# Patient Record
Sex: Female | Born: 1960 | Race: White | Hispanic: No | Marital: Married | Smoking: Never smoker
Health system: Southern US, Community
[De-identification: ages and names within clinical notes are randomized; demographics above are authoritative.]

## PROBLEM LIST (undated history)

## (undated) DIAGNOSIS — M1990 Unspecified osteoarthritis, unspecified site: Secondary | ICD-10-CM

## (undated) DIAGNOSIS — I10 Essential (primary) hypertension: Secondary | ICD-10-CM

## (undated) DIAGNOSIS — M719 Bursopathy, unspecified: Secondary | ICD-10-CM

## (undated) DIAGNOSIS — F419 Anxiety disorder, unspecified: Secondary | ICD-10-CM

## (undated) DIAGNOSIS — F32A Depression, unspecified: Secondary | ICD-10-CM

## (undated) DIAGNOSIS — M7580 Other shoulder lesions, unspecified shoulder: Secondary | ICD-10-CM

## (undated) DIAGNOSIS — E119 Type 2 diabetes mellitus without complications: Secondary | ICD-10-CM

## (undated) DIAGNOSIS — R519 Headache, unspecified: Secondary | ICD-10-CM

## (undated) HISTORY — PX: CHOLECYSTECTOMY: SHX55

## (undated) HISTORY — PX: KNEE SURGERY: SHX244

## (undated) HISTORY — PX: TUBAL LIGATION: SHX77

## (undated) HISTORY — PX: MOUTH SURGERY: SHX715

---

## 1998-02-19 ENCOUNTER — Other Ambulatory Visit: Admission: RE | Admit: 1998-02-19 | Discharge: 1998-02-19 | Payer: Self-pay | Admitting: Gynecology

## 2002-05-07 ENCOUNTER — Emergency Department (HOSPITAL_COMMUNITY): Admission: EM | Admit: 2002-05-07 | Discharge: 2002-05-07 | Payer: Self-pay | Admitting: Emergency Medicine

## 2005-02-05 ENCOUNTER — Emergency Department (HOSPITAL_COMMUNITY): Admission: EM | Admit: 2005-02-05 | Discharge: 2005-02-05 | Payer: Self-pay | Admitting: Emergency Medicine

## 2008-03-02 ENCOUNTER — Encounter: Admission: RE | Admit: 2008-03-02 | Discharge: 2008-03-02 | Payer: Self-pay | Admitting: Neurology

## 2008-09-19 ENCOUNTER — Emergency Department (HOSPITAL_COMMUNITY): Admission: EM | Admit: 2008-09-19 | Discharge: 2008-09-19 | Payer: Self-pay | Admitting: Emergency Medicine

## 2009-05-15 ENCOUNTER — Emergency Department (HOSPITAL_COMMUNITY): Admission: EM | Admit: 2009-05-15 | Discharge: 2009-05-16 | Payer: Self-pay | Admitting: Emergency Medicine

## 2010-06-07 LAB — POCT CARDIAC MARKERS
CKMB, poc: 1.2 ng/mL (ref 1.0–8.0)
CKMB, poc: 1.6 ng/mL (ref 1.0–8.0)
Myoglobin, poc: 47.7 ng/mL (ref 12–200)
Myoglobin, poc: 70.5 ng/mL (ref 12–200)
Troponin i, poc: <0.05 ng/mL (ref 0.00–0.09)
Troponin i, poc: <0.05 ng/mL (ref 0.00–0.09)

## 2010-06-07 LAB — DIFFERENTIAL
Basophils Absolute: 0 10*3/uL (ref 0.0–0.1)
Lymphocytes Relative: 24 % (ref 12–46)
Neutro Abs: 4.7 10*3/uL (ref 1.7–7.7)
Neutrophils Relative %: 66 % (ref 43–77)

## 2010-06-07 LAB — HEPATIC FUNCTION PANEL
ALT: 12 U/L (ref 0–35)
AST: 17 U/L (ref 0–37)
Albumin: 3.3 g/dL — ABNORMAL LOW (ref 3.5–5.2)
Alkaline Phosphatase: 70 U/L (ref 39–117)
Bilirubin, Direct: 0.1 mg/dL (ref 0.0–0.3)
Indirect Bilirubin: 0.6 mg/dL (ref 0.3–0.9)
Total Bilirubin: 0.7 mg/dL (ref 0.3–1.2)
Total Protein: 6.1 g/dL (ref 6.0–8.3)

## 2010-06-07 LAB — BASIC METABOLIC PANEL
BUN: 9 mg/dL (ref 6–23)
Calcium: 8.9 mg/dL (ref 8.4–10.5)
GFR calc non Af Amer: >60 mL/min (ref >60)
Glucose, Bld: 91 mg/dL (ref 70–99)

## 2010-06-07 LAB — CBC
Platelets: 218 10*3/uL (ref 150–400)
RDW: 12.4 % (ref 11.5–15.5)

## 2010-06-07 LAB — D-DIMER, QUANTITATIVE: D-Dimer, Quant: 0.42 ug/mL-FEU (ref 0.00–0.48)

## 2011-12-05 ENCOUNTER — Emergency Department (HOSPITAL_COMMUNITY)
Admission: EM | Admit: 2011-12-05 | Discharge: 2011-12-05 | Disposition: A | Discharge status: Home / Self Care | Payer: 59 | Attending: Emergency Medicine | Admitting: Emergency Medicine

## 2011-12-05 ENCOUNTER — Encounter (HOSPITAL_COMMUNITY): Payer: Self-pay | Admitting: *Deleted

## 2011-12-05 ENCOUNTER — Emergency Department (HOSPITAL_COMMUNITY): Payer: 59

## 2011-12-05 DIAGNOSIS — Q762 Congenital spondylolisthesis: Secondary | ICD-10-CM | POA: Insufficient documentation

## 2011-12-05 DIAGNOSIS — M51379 Other intervertebral disc degeneration, lumbosacral region without mention of lumbar back pain or lower extremity pain: Secondary | ICD-10-CM | POA: Insufficient documentation

## 2011-12-05 DIAGNOSIS — Z886 Allergy status to analgesic agent status: Secondary | ICD-10-CM | POA: Insufficient documentation

## 2011-12-05 DIAGNOSIS — M5416 Radiculopathy, lumbar region: Secondary | ICD-10-CM

## 2011-12-05 DIAGNOSIS — M5137 Other intervertebral disc degeneration, lumbosacral region: Secondary | ICD-10-CM | POA: Insufficient documentation

## 2011-12-05 DIAGNOSIS — Z91038 Other insect allergy status: Secondary | ICD-10-CM | POA: Insufficient documentation

## 2011-12-05 MED ORDER — ONDANSETRON 8 MG PO TBDP
8.0000 mg | ORAL_TABLET | Freq: Once | ORAL | Status: AC
  Administered 2011-12-05: 8 mg via ORAL
  Filled 2011-12-05: qty 1

## 2011-12-05 MED ORDER — KETOROLAC TROMETHAMINE 60 MG/2ML IM SOLN
60.0000 mg | Freq: Once | INTRAMUSCULAR | Status: AC
  Administered 2011-12-05: 60 mg via INTRAMUSCULAR
  Filled 2011-12-05: qty 2

## 2011-12-05 MED ORDER — CYCLOBENZAPRINE HCL 10 MG PO TABS: 10.0000 mg | ORAL_TABLET | Freq: Two times a day (BID) | ORAL | Status: DC | PRN

## 2011-12-05 MED ORDER — CYCLOBENZAPRINE HCL 10 MG PO TABS
10.0000 mg | ORAL_TABLET | Freq: Once | ORAL | Status: AC
  Administered 2011-12-05: 10 mg via ORAL
  Filled 2011-12-05: qty 1

## 2011-12-05 MED ORDER — ONDANSETRON HCL 8 MG PO TABS: 8.0000 mg | ORAL_TABLET | Freq: Three times a day (TID) | ORAL | Status: DC | PRN

## 2011-12-05 MED ORDER — MORPHINE SULFATE 2 MG/ML IJ SOLN
8.0000 mg | Freq: Once | INTRAMUSCULAR | Status: AC
  Administered 2011-12-05: 8 mg via INTRAMUSCULAR
  Filled 2011-12-05: qty 4

## 2011-12-05 MED ORDER — OXYCODONE-ACETAMINOPHEN 10-325 MG PO TABS: 1.0000 | ORAL_TABLET | ORAL | Status: DC | PRN

## 2011-12-05 NOTE — ED Notes (Signed)
Pain lower back to lt hip, onset yesterday, no known injury

## 2011-12-05 NOTE — ED Provider Notes (Signed)
History     CSN: [id number]  Arrival date & time 12/05/11  1620   First MD Initiated Contact with Patient 12/05/11 1704      Chief Complaint  Patient presents with  . Back Pain    (Consider location/radiation/quality/duration/timing/severity/associated sxs/prior treatment) HPI Comments: Patient with hx of chronic low back pain states that she developed a sharp, burning, shooting pain in her left lower back that radiates into her left buttocks , thigh and knee.  She is unsure of known injury.  States that she currently is under the care of a local chiropractor for her back pain and seen him PTA and was advised to come to the ED for further evaluation.  She denies saddle anesthesia's, urinary sx's, incontinence, motor weakness or numbness of the leg, abd pain, or fever  Patient is a 51 y.o. female presenting with back pain. The history is provided by the patient and the spouse.  Back Pain  This is a new problem. Episode onset: on day PTA. The problem occurs constantly. The problem has been gradually worsening. The pain is associated with no known injury. The pain is present in the lumbar spine and sacro-iliac joint. The quality of the pain is described as aching, burning and shooting. The pain radiates to the left thigh and left knee. The pain is moderate. The symptoms are aggravated by bending, twisting and certain positions. The pain is the same all the time. Associated symptoms include leg pain. Pertinent negatives include no chest pain, no fever, no numbness, no headaches, no abdominal pain, no abdominal swelling, no bowel incontinence, no perianal numbness, no bladder incontinence, no dysuria, no pelvic pain, no paresthesias, no paresis, no tingling and no weakness. She has tried muscle relaxants and analgesics for the symptoms. The treatment provided no relief.    History reviewed. No pertinent past medical history.  Past Surgical History  Procedure Date  . Knee surgery   .  Cholecystectomy   . Tubal ligation     History reviewed. No pertinent family history.  History  Substance Use Topics  . Smoking status: Never Smoker   . Smokeless tobacco: Not on file  . Alcohol Use: Yes     occ    OB History    Grav Para Term Preterm Abortions TAB SAB Ect Mult Living                  Review of Systems  Constitutional: Negative for fever.  Respiratory: Negative for shortness of breath.   Cardiovascular: Negative for chest pain.  Gastrointestinal: Negative for vomiting, abdominal pain, constipation and bowel incontinence.  Genitourinary: Negative for bladder incontinence, dysuria, hematuria, flank pain, decreased urine volume, difficulty urinating and pelvic pain.       No perineal numbness or incontinence of urine or feces  Musculoskeletal: Positive for back pain and gait problem. Negative for joint swelling.  Skin: Negative for rash.  Neurological: Negative for dizziness, tingling, weakness, numbness, headaches and paresthesias.  All other systems reviewed and are negative.    Allergies  Bee venom; Codeine; and Darvocet  Home Medications   Current Outpatient Rx  Name Route Sig Dispense Refill  . ACETAMINOPHEN ER 650 MG PO TBCR Oral Take 650 mg by mouth 2 (two) times daily as needed. For pain    . CARISOPRODOL 350 MG PO TABS Oral Take 350 mg by mouth daily as needed. For pain in back    . MELOXICAM PO Oral Take 1 tablet by mouth daily.  BP 122/56  Pulse 63  Temp 98.2 F (36.8 C) (Oral)  Resp 18  Ht 5' 6.5" (1.689 m)  Wt 340 lb (154.223 kg)  BMI 54.06 kg/m2  SpO2 96%  LMP 10/24/2011  Physical Exam  Nursing note and vitals reviewed. Constitutional: She is oriented to person, place, and time. She appears well-developed and well-nourished. No distress.       Morbidly obese  HENT:  Head: Normocephalic and atraumatic.  Neck: Normal range of motion. Neck supple.  Cardiovascular: Normal rate, regular rhythm and intact distal pulses.   No  murmur heard. Pulmonary/Chest: Effort normal and breath sounds normal.  Musculoskeletal: She exhibits tenderness. She exhibits no edema.       Lumbar back: She exhibits tenderness and pain. She exhibits normal range of motion, no swelling, no deformity, no laceration and normal pulse.       Back:  Neurological: She is alert and oriented to person, place, and time. She has normal strength. No cranial nerve deficit or sensory deficit. She exhibits normal muscle tone. Coordination and gait normal.  Reflex Scores:      Patellar reflexes are 2+ on the right side and 2+ on the left side.      Achilles reflexes are 2+ on the right side and 2+ on the left side. Skin: Skin is warm and dry.    ED Course  Procedures (including critical care time)  Labs Reviewed - No data to display Dg Lumbar Spine Complete  12/05/2011  *RADIOLOGY REPORT*  Clinical Data: 50 year old female with low back pain radiating to the left hip.  LUMBAR SPINE - COMPLETE 4+ VIEW  Comparison: None.  Findings: Normal lumbar segmentation.  Trace anterolisthesis of L4- L5 associated with moderate facet hypertrophy.  Relatively preserved disc spaces except at L1-L2 where there is associated endplate spurring.  No pars fracture.  Sacral ala and SI joints within normal limits.  IMPRESSION: 1. No acute osseous abnormality in the lumbar spine. 2.  Chronic L1-L2 disc degeneration.  Evidence of chronic mild grade 1 spondylolisthesis at L4-L5.   Original Report Authenticated By: Harley Hallmark, M.D.         MDM   Vitals stable, patient is feeling better.     Patient has ttp of the left lumbar spine and paraspinal muscles.  No focal neuro deficits on exam. No saddle anesthesia, incontinence, or foot drop.   Ambulated to the restroom with a slow, but steady gait.   Sx's are concerning for nerve impingement syndrome.  Pt has appt with her PMD, Dr. Margo Aye tomorrow.  Pt will likely need MRI, but I do not suspect emergent/surgical neurological  process at this time.    The patient appears reasonably screened and/or stabilized for discharge and I doubt any other medical condition or other [facility] requiring further screening, evaluation, or treatment in the ED at this time prior to discharge.   Prescribed: Flexeril Percocet 10mg  #15 zofran    Saniyya Gau L. [address], [place] 12/07/11 2136

## 2011-12-09 NOTE — ED Provider Notes (Signed)
Medical screening examination/treatment/procedure(s) were performed by non-physician practitioner and as supervising physician I was immediately available for consultation/collaboration.   Shelda Jakes, MD 12/09/11 1007

## 2012-08-06 ENCOUNTER — Emergency Department (HOSPITAL_COMMUNITY)
Admission: EM | Admit: 2012-08-06 | Discharge: 2012-08-06 | Disposition: A | Discharge status: Home / Self Care | Payer: 59 | Attending: Emergency Medicine | Admitting: Emergency Medicine

## 2012-08-06 ENCOUNTER — Emergency Department (HOSPITAL_COMMUNITY): Payer: 59

## 2012-08-06 ENCOUNTER — Encounter (HOSPITAL_COMMUNITY): Payer: Self-pay

## 2012-08-06 DIAGNOSIS — W278XXA Contact with other nonpowered hand tool, initial encounter: Secondary | ICD-10-CM | POA: Insufficient documentation

## 2012-08-06 DIAGNOSIS — Y929 Unspecified place or not applicable: Secondary | ICD-10-CM | POA: Insufficient documentation

## 2012-08-06 DIAGNOSIS — Z23 Encounter for immunization: Secondary | ICD-10-CM | POA: Insufficient documentation

## 2012-08-06 DIAGNOSIS — S61432A Puncture wound without foreign body of left hand, initial encounter: Secondary | ICD-10-CM

## 2012-08-06 DIAGNOSIS — Y9389 Activity, other specified: Secondary | ICD-10-CM | POA: Insufficient documentation

## 2012-08-06 DIAGNOSIS — R209 Unspecified disturbances of skin sensation: Secondary | ICD-10-CM | POA: Insufficient documentation

## 2012-08-06 DIAGNOSIS — Z79899 Other long term (current) drug therapy: Secondary | ICD-10-CM | POA: Insufficient documentation

## 2012-08-06 DIAGNOSIS — Z8739 Personal history of other diseases of the musculoskeletal system and connective tissue: Secondary | ICD-10-CM | POA: Insufficient documentation

## 2012-08-06 DIAGNOSIS — S61409A Unspecified open wound of unspecified hand, initial encounter: Secondary | ICD-10-CM | POA: Insufficient documentation

## 2012-08-06 HISTORY — DX: Other shoulder lesions, unspecified shoulder: M75.80

## 2012-08-06 HISTORY — DX: Bursopathy, unspecified: M71.9

## 2012-08-06 MED ORDER — SULFAMETHOXAZOLE-TMP DS 800-160 MG PO TABS
1.0000 | ORAL_TABLET | Freq: Once | ORAL | Status: AC
  Administered 2012-08-06: 1 via ORAL
  Filled 2012-08-06: qty 1

## 2012-08-06 MED ORDER — TRAMADOL HCL 50 MG PO TABS
50.0000 mg | ORAL_TABLET | Freq: Once | ORAL | Status: AC
  Administered 2012-08-06: 50 mg via ORAL
  Filled 2012-08-06: qty 1

## 2012-08-06 MED ORDER — TETANUS-DIPHTH-ACELL PERTUSSIS 5-2.5-18.5 LF-MCG/0.5 IM SUSP
0.5000 mL | Freq: Once | INTRAMUSCULAR | Status: AC
  Administered 2012-08-06: 0.5 mL via INTRAMUSCULAR
  Filled 2012-08-06: qty 0.5

## 2012-08-06 MED ORDER — SULFAMETHOXAZOLE-TRIMETHOPRIM 800-160 MG PO TABS: 1.0000 | ORAL_TABLET | Freq: Two times a day (BID) | ORAL | Status: DC

## 2012-08-06 MED ORDER — TRAMADOL HCL 50 MG PO TABS: 50.0000 mg | ORAL_TABLET | Freq: Four times a day (QID) | ORAL | Status: DC | PRN

## 2012-08-06 NOTE — ED Provider Notes (Signed)
History     CSN: [id number]  Arrival date & time 08/06/12  1440   First MD Initiated Contact with Patient 08/06/12 [phone]      Chief Complaint  Patient presents with  . Extremity Laceration    (Consider location/radiation/quality/duration/timing/severity/associated sxs/prior treatment) HPI Comments: Patient c/o laceration to the left palm that occurred while using scissors.  C/o shooting type pain from the middle palm to the left thumb.  states the thumb feels numb and tingling.  Pain to the hand with movement of the thumb and other fingers.  She is unsure of her last tetanus.    Patient is a 52 y.o. female presenting with skin laceration. The history is provided by the patient.  Laceration Location:  Hand Hand laceration location:  L hand and L palm Length (cm):  1 cm Depth:  Through dermis Quality comment:  Puncture wound Bleeding: controlled   Time since incident: just PTA. Injury mechanism: scissors. Pain details:    Quality:  Sharp and shooting   Severity:  Mild   Timing:  Constant   Progression:  Unchanged Foreign body present:  No foreign bodies Relieved by:  Nothing Worsened by:  Movement Ineffective treatments:  None tried Tetanus status:  Out of date   Past Medical History  Diagnosis Date  . Bursitis   . AC (acromioclavicular) joint bone spurs     Past Surgical History  Procedure Laterality Date  . Knee surgery    . Cholecystectomy    . Tubal ligation      No family history on file.  History  Substance Use Topics  . Smoking status: Never Smoker   . Smokeless tobacco: Not on file  . Alcohol Use: Yes     Comment: occ    OB History   Grav Para Term Preterm Abortions TAB SAB Ect Mult Living                  Review of Systems  Constitutional: Negative for fever and chills.  Musculoskeletal: Negative for back pain, joint swelling and arthralgias.  Skin: Positive for wound. Negative for color change.       Laceration   Neurological: Positive  for numbness. Negative for dizziness and weakness.  Hematological: Does not bruise/bleed easily.  All other systems reviewed and are negative.    Allergies  Bee venom; Codeine; and Darvocet  Home Medications   Current Outpatient Rx  Name  Route  Sig  Dispense  Refill  . B Complex-Biotin-FA (B-COMPLEX PO)   Oral   Take 1 tablet by mouth daily.         . carisoprodol (SOMA) 350 MG tablet   Oral   Take 350 mg by mouth daily as needed. For pain in back         . meloxicam (MOBIC) 15 MG tablet   Oral   Take 15 mg by mouth daily.         . methylcellulose (ARTIFICIAL TEARS) 1 % ophthalmic solution   Both Eyes   Place 1 drop into both eyes daily as needed (for dry eye relief).         . pregabalin (LYRICA) 75 MG capsule   Oral   Take 75 mg by mouth 2 (two) times daily.           BP 122/78  Pulse 80  Temp(Src) 97.1 F (36.2 C) (Oral)  Resp 20  Ht 5' 6.5" (1.689 m)  Wt 340 lb (154.223 kg)  BMI 54.06  kg/m2  SpO2 99%  Physical Exam  Nursing note and vitals reviewed. Constitutional: She is oriented to person, place, and time. She appears well-developed and well-nourished. No distress.  HENT:  Head: Normocephalic and atraumatic.  Cardiovascular: Normal rate, regular rhythm, normal heart sounds and intact distal pulses.   No murmur heard. Pulmonary/Chest: Effort normal and breath sounds normal. No respiratory distress.  Musculoskeletal: She exhibits tenderness. She exhibits no edema.       Left hand: She exhibits decreased range of motion and tenderness. She exhibits no bony tenderness, normal two-point discrimination, normal capillary refill, no deformity and no swelling. Normal sensation noted. Decreased strength noted. She exhibits thumb/finger opposition. She exhibits no finger abduction and no wrist extension trouble.       Hands: Patient will not attempt to perform finger/thumb opposition stating that the pain is too severe and because the thumb is numb.   Moves the distal end of the thumb w/o difficulty,  Radial pulse is brisk, distal sensation intact.  CR< 2 sec  Neurological: She is alert and oriented to person, place, and time. She exhibits normal muscle tone. Coordination normal.  Skin: Skin is warm and dry. Laceration noted.    ED Course  Procedures (including critical care time)  Labs Reviewed - No data to display Dg Hand Complete Left  08/06/2012   *RADIOLOGY REPORT*  Clinical Data: Extremity laceration.  Puncture wound in the middle palm from scissors.  Unable to move the thumb.  LEFT HAND - COMPLETE 3+ VIEW  Comparison: None.  Findings: There is degenerative change involve the distal interphalangeal joints of the second and third digits.  No evidence for acute fracture or subluxation.  No radiopaque foreign body or soft tissue gas.  IMPRESSION:  1. No evidence for acute  abnormality. 2.  Degenerative changes.   Original Report Authenticated By: Norva Pavlov, M.D.        MDM    2 cm puncture type wound to the proximal palm.  Bleeding controlled.  Pt c/o numbness and "thumb feel cold", but cap refill is brisk and pt able to move the distal thumb.  No discoloration to the thumb.  Skin is warm and dry.    She is unwilling to perform finger/thumb opposition stating that she "can;t because it feels dead"  Patient was also seen by Dr. Ranae Palms and care plan discussed.    The wound was cleaned with saline and wound cleanser, dressing applied and velcro thumb spica also applied.  TDaP given.  Pain improved with splint, remains NV intact.  Discussed possible nerve/muscle injury with the patient and she agrees to elevate , keep bandaged and close f/u with local orthopedics or hand surgeon in [place].  Referral info given.  Patient also agrees to return here for any discoloration of the digits, swelling or red streaking.      Breannah Kratt L. Trisha Mangle, PA-C 08/10/12 2004

## 2012-08-06 NOTE — ED Notes (Signed)
Lac to palm of lt hand, with scissors.  Unable to fully move thumb, and numbness to thumb.

## 2012-08-06 NOTE — ED Notes (Signed)
Pt reports stabbing her left hand with scissors a few mins ago.  Pt reports the scissors stabbing toward her thumb and now she has tingling to the area.  Bleeding is controlled

## 2012-08-15 NOTE — ED Provider Notes (Signed)
Medical screening examination/treatment/procedure(s) were performed by non-physician practitioner and as supervising physician I was immediately available for consultation/collaboration.   Loren Racer, MD 08/15/12 [phone]

## 2013-04-04 ENCOUNTER — Ambulatory Visit (HOSPITAL_COMMUNITY)
Admission: RE | Admit: 2013-04-04 | Discharge: 2013-04-04 | Disposition: A | Discharge status: Home / Self Care | Payer: Disability Insurance | Source: Ambulatory Visit | Attending: Family Medicine | Admitting: Family Medicine

## 2013-04-04 ENCOUNTER — Other Ambulatory Visit (HOSPITAL_COMMUNITY): Payer: Self-pay | Admitting: Family Medicine

## 2013-04-04 DIAGNOSIS — M545 Low back pain, unspecified: Secondary | ICD-10-CM

## 2013-04-04 DIAGNOSIS — M25561 Pain in right knee: Secondary | ICD-10-CM

## 2013-04-04 DIAGNOSIS — M1710 Unilateral primary osteoarthritis, unspecified knee: Secondary | ICD-10-CM | POA: Insufficient documentation

## 2013-04-04 DIAGNOSIS — M51379 Other intervertebral disc degeneration, lumbosacral region without mention of lumbar back pain or lower extremity pain: Secondary | ICD-10-CM | POA: Insufficient documentation

## 2013-04-04 DIAGNOSIS — M5137 Other intervertebral disc degeneration, lumbosacral region: Secondary | ICD-10-CM | POA: Insufficient documentation

## 2013-04-04 DIAGNOSIS — M25562 Pain in left knee: Secondary | ICD-10-CM

## 2014-03-05 ENCOUNTER — Ambulatory Visit (HOSPITAL_COMMUNITY)
Admission: RE | Admit: 2014-03-05 | Discharge: 2014-03-05 | Disposition: A | Discharge status: Home / Self Care | Payer: BC Managed Care – PPO | Source: Ambulatory Visit | Attending: Family Medicine | Admitting: Family Medicine

## 2014-03-05 ENCOUNTER — Other Ambulatory Visit (HOSPITAL_COMMUNITY): Payer: Self-pay | Admitting: Family Medicine

## 2014-03-05 DIAGNOSIS — M779 Enthesopathy, unspecified: Secondary | ICD-10-CM | POA: Insufficient documentation

## 2014-03-05 DIAGNOSIS — M25511 Pain in right shoulder: Secondary | ICD-10-CM

## 2014-03-05 DIAGNOSIS — R200 Anesthesia of skin: Secondary | ICD-10-CM | POA: Insufficient documentation

## 2014-03-05 DIAGNOSIS — M25521 Pain in right elbow: Secondary | ICD-10-CM | POA: Diagnosis not present

## 2014-03-05 DIAGNOSIS — W19XXXA Unspecified fall, initial encounter: Secondary | ICD-10-CM | POA: Insufficient documentation

## 2014-08-21 ENCOUNTER — Other Ambulatory Visit (HOSPITAL_COMMUNITY): Payer: Self-pay | Admitting: Family Medicine

## 2014-08-21 ENCOUNTER — Ambulatory Visit (HOSPITAL_COMMUNITY)
Admission: RE | Admit: 2014-08-21 | Discharge: 2014-08-21 | Disposition: A | Discharge status: Home / Self Care | Payer: BLUE CROSS/BLUE SHIELD | Source: Ambulatory Visit | Attending: Family Medicine | Admitting: Family Medicine

## 2014-08-21 DIAGNOSIS — M25532 Pain in left wrist: Secondary | ICD-10-CM | POA: Insufficient documentation

## 2014-08-21 DIAGNOSIS — M79642 Pain in left hand: Secondary | ICD-10-CM

## 2015-01-06 ENCOUNTER — Other Ambulatory Visit: Payer: Self-pay | Admitting: Chiropractic Medicine

## 2015-01-06 DIAGNOSIS — M48061 Spinal stenosis, lumbar region without neurogenic claudication: Secondary | ICD-10-CM

## 2015-01-23 ENCOUNTER — Ambulatory Visit
Admission: RE | Admit: 2015-01-23 | Discharge: 2015-01-23 | Disposition: A | Discharge status: Home / Self Care | Payer: BLUE CROSS/BLUE SHIELD | Source: Ambulatory Visit | Attending: Chiropractic Medicine | Admitting: Chiropractic Medicine

## 2015-01-23 DIAGNOSIS — M48061 Spinal stenosis, lumbar region without neurogenic claudication: Secondary | ICD-10-CM

## 2015-02-10 ENCOUNTER — Encounter (INDEPENDENT_AMBULATORY_CARE_PROVIDER_SITE_OTHER): Payer: Self-pay | Admitting: *Deleted

## 2015-05-26 ENCOUNTER — Institutional Professional Consult (permissible substitution): Payer: BLUE CROSS/BLUE SHIELD | Admitting: Neurology

## 2015-07-31 ENCOUNTER — Other Ambulatory Visit (HOSPITAL_COMMUNITY): Payer: Self-pay | Admitting: Family Medicine

## 2015-07-31 DIAGNOSIS — Z1231 Encounter for screening mammogram for malignant neoplasm of breast: Secondary | ICD-10-CM

## 2015-08-17 ENCOUNTER — Ambulatory Visit (HOSPITAL_COMMUNITY)
Admission: RE | Admit: 2015-08-17 | Discharge: 2015-08-17 | Disposition: A | Discharge status: Home / Self Care | Payer: BLUE CROSS/BLUE SHIELD | Source: Ambulatory Visit | Attending: Family Medicine | Admitting: Family Medicine

## 2015-08-17 DIAGNOSIS — Z1231 Encounter for screening mammogram for malignant neoplasm of breast: Secondary | ICD-10-CM | POA: Insufficient documentation

## 2015-08-24 DIAGNOSIS — M5440 Lumbago with sciatica, unspecified side: Secondary | ICD-10-CM | POA: Insufficient documentation

## 2018-01-25 DIAGNOSIS — E1140 Type 2 diabetes mellitus with diabetic neuropathy, unspecified: Secondary | ICD-10-CM | POA: Diagnosis not present

## 2018-01-25 DIAGNOSIS — Z1389 Encounter for screening for other disorder: Secondary | ICD-10-CM | POA: Diagnosis not present

## 2018-01-25 DIAGNOSIS — I10 Essential (primary) hypertension: Secondary | ICD-10-CM | POA: Diagnosis not present

## 2018-01-25 DIAGNOSIS — G8929 Other chronic pain: Secondary | ICD-10-CM | POA: Diagnosis not present

## 2018-01-25 DIAGNOSIS — Z6841 Body Mass Index (BMI) 40.0 and over, adult: Secondary | ICD-10-CM | POA: Diagnosis not present

## 2018-01-25 DIAGNOSIS — E782 Mixed hyperlipidemia: Secondary | ICD-10-CM | POA: Diagnosis not present

## 2018-04-27 DIAGNOSIS — I10 Essential (primary) hypertension: Secondary | ICD-10-CM | POA: Diagnosis not present

## 2018-04-27 DIAGNOSIS — Z1389 Encounter for screening for other disorder: Secondary | ICD-10-CM | POA: Diagnosis not present

## 2018-04-27 DIAGNOSIS — E7849 Other hyperlipidemia: Secondary | ICD-10-CM | POA: Diagnosis not present

## 2018-04-27 DIAGNOSIS — E1140 Type 2 diabetes mellitus with diabetic neuropathy, unspecified: Secondary | ICD-10-CM | POA: Diagnosis not present

## 2018-04-27 DIAGNOSIS — Z0001 Encounter for general adult medical examination with abnormal findings: Secondary | ICD-10-CM | POA: Diagnosis not present

## 2018-04-27 DIAGNOSIS — G8929 Other chronic pain: Secondary | ICD-10-CM | POA: Diagnosis not present

## 2018-05-15 DIAGNOSIS — I10 Essential (primary) hypertension: Secondary | ICD-10-CM | POA: Diagnosis not present

## 2018-05-15 DIAGNOSIS — M545 Low back pain: Secondary | ICD-10-CM | POA: Diagnosis not present

## 2018-05-15 DIAGNOSIS — M5416 Radiculopathy, lumbar region: Secondary | ICD-10-CM | POA: Diagnosis not present

## 2018-05-15 DIAGNOSIS — M542 Cervicalgia: Secondary | ICD-10-CM | POA: Diagnosis not present

## 2018-05-15 DIAGNOSIS — M5412 Radiculopathy, cervical region: Secondary | ICD-10-CM | POA: Diagnosis not present

## 2018-05-30 ENCOUNTER — Emergency Department (HOSPITAL_COMMUNITY): Payer: Medicare Other

## 2018-05-30 ENCOUNTER — Encounter (HOSPITAL_COMMUNITY): Payer: Self-pay | Admitting: Emergency Medicine

## 2018-05-30 ENCOUNTER — Other Ambulatory Visit: Payer: Self-pay

## 2018-05-30 ENCOUNTER — Emergency Department (HOSPITAL_COMMUNITY)
Admission: EM | Admit: 2018-05-30 | Discharge: 2018-05-30 | Disposition: A | Discharge status: Home / Self Care | Payer: Medicare Other | Attending: Emergency Medicine | Admitting: Emergency Medicine

## 2018-05-30 DIAGNOSIS — S0990XA Unspecified injury of head, initial encounter: Secondary | ICD-10-CM | POA: Diagnosis not present

## 2018-05-30 DIAGNOSIS — S62112A Displaced fracture of triquetrum [cuneiform] bone, left wrist, initial encounter for closed fracture: Secondary | ICD-10-CM | POA: Diagnosis not present

## 2018-05-30 DIAGNOSIS — R1012 Left upper quadrant pain: Secondary | ICD-10-CM | POA: Insufficient documentation

## 2018-05-30 DIAGNOSIS — W19XXXA Unspecified fall, initial encounter: Secondary | ICD-10-CM

## 2018-05-30 DIAGNOSIS — Y92009 Unspecified place in unspecified non-institutional (private) residence as the place of occurrence of the external cause: Secondary | ICD-10-CM | POA: Diagnosis not present

## 2018-05-30 DIAGNOSIS — R51 Headache: Secondary | ICD-10-CM | POA: Diagnosis not present

## 2018-05-30 DIAGNOSIS — Z7982 Long term (current) use of aspirin: Secondary | ICD-10-CM | POA: Insufficient documentation

## 2018-05-30 DIAGNOSIS — Y93E9 Activity, other interior property and clothing maintenance: Secondary | ICD-10-CM | POA: Insufficient documentation

## 2018-05-30 DIAGNOSIS — W1789XA Other fall from one level to another, initial encounter: Secondary | ICD-10-CM | POA: Diagnosis not present

## 2018-05-30 DIAGNOSIS — S2232XA Fracture of one rib, left side, initial encounter for closed fracture: Secondary | ICD-10-CM | POA: Diagnosis not present

## 2018-05-30 DIAGNOSIS — Y999 Unspecified external cause status: Secondary | ICD-10-CM | POA: Diagnosis not present

## 2018-05-30 DIAGNOSIS — R0602 Shortness of breath: Secondary | ICD-10-CM | POA: Diagnosis not present

## 2018-05-30 DIAGNOSIS — M25562 Pain in left knee: Secondary | ICD-10-CM | POA: Diagnosis not present

## 2018-05-30 DIAGNOSIS — S299XXA Unspecified injury of thorax, initial encounter: Secondary | ICD-10-CM | POA: Diagnosis present

## 2018-05-30 DIAGNOSIS — Z79899 Other long term (current) drug therapy: Secondary | ICD-10-CM | POA: Insufficient documentation

## 2018-05-30 DIAGNOSIS — R109 Unspecified abdominal pain: Secondary | ICD-10-CM | POA: Diagnosis not present

## 2018-05-30 DIAGNOSIS — S92152A Displaced avulsion fracture (chip fracture) of left talus, initial encounter for closed fracture: Secondary | ICD-10-CM | POA: Diagnosis not present

## 2018-05-30 LAB — BASIC METABOLIC PANEL
ANION GAP: 8 (ref 5–15)
BUN: 14 mg/dL (ref 6–20)
CO2: 24 mmol/L (ref 22–32)
Calcium: 9.2 mg/dL (ref 8.9–10.3)
Chloride: 102 mmol/L (ref 98–111)
Creatinine, Ser: 0.79 mg/dL (ref 0.44–1.00)
GFR calc Af Amer: >60 mL/min (ref >60)
GFR calc non Af Amer: >60 mL/min (ref >60)
Glucose, Bld: 148 mg/dL — ABNORMAL HIGH (ref 70–99)
POTASSIUM: 4.1 mmol/L (ref 3.5–5.1)
Sodium: 134 mmol/L — ABNORMAL LOW (ref 135–145)

## 2018-05-30 LAB — CBC
HEMATOCRIT: 41.3 % (ref 36.0–46.0)
Hemoglobin: 13.7 g/dL (ref 12.0–15.0)
MCH: 31.6 pg (ref 26.0–34.0)
MCHC: 33.2 g/dL (ref 30.0–36.0)
MCV: 95.2 fL (ref 80.0–100.0)
NRBC: 0 % (ref 0.0–0.2)
Platelets: 217 10*3/uL (ref 150–400)
RBC: 4.34 MIL/uL (ref 3.87–5.11)
RDW: 13 % (ref 11.5–15.5)
WBC: 7.8 10*3/uL (ref 4.0–10.5)

## 2018-05-30 MED ORDER — IOHEXOL 300 MG/ML  SOLN
100.0000 mL | Freq: Once | INTRAMUSCULAR | Status: AC | PRN
  Administered 2018-05-30: 100 mL via INTRAVENOUS

## 2018-05-30 MED ORDER — TRAMADOL HCL 50 MG PO TABS: 50.0000 mg | ORAL_TABLET | Freq: Four times a day (QID) | ORAL | 0 refills | Status: DC | PRN

## 2018-05-30 NOTE — ED Provider Notes (Signed)
Fracture and possible closed chip fracture from the triquetrum noted.  Patient splinted.  Will discharge home with pain medications   Linwood Dibbles, MD 05/30/18 1728

## 2018-05-30 NOTE — ED Triage Notes (Signed)
Lost her balance and fell at home, complaining of left sided pain, head, wrist and ribs.

## 2018-05-30 NOTE — ED Provider Notes (Signed)
[facility] EMERGENCY DEPARTMENT Provider Note   CSN: [id number] Arrival date & time: 05/30/18  1406    History   Chief Complaint Chief Complaint  Patient presents with  . Fall    HPI Lorraine Horton is a 58 y.o. female.     HPI Patient presents after a fall.  Last night she was hanging up a picture and fell onto her left side on her old floor.  States her feet were around 42 inches in the air.  States her left wrist under her left chest/abdomen.  Complaining of pain in her left chest.  States it hurts to breathe.  Also pain in the left wrist and head.  Also so some mild left knee pain.  No relief with patient's chronic medicines at home.  No lightheadedness.  No fevers.  States her left hand feels numb.  She is not on anticoagulation. Past Medical History:  Diagnosis Date  . AC (acromioclavicular) joint bone spurs   . Bursitis     There are no active problems to display for this patient.   Past Surgical History:  Procedure Laterality Date  . CHOLECYSTECTOMY    . KNEE SURGERY    . TUBAL LIGATION       OB History   No obstetric history on file.      Home Medications    Prior to Admission medications   Medication Sig Start Date End Date Taking? Authorizing Provider  aspirin 81 MG chewable tablet Chew 1 tablet by mouth daily.    [provider]  atorvastatin (LIPITOR) 20 MG tablet Take 20 mg by mouth daily. 04/27/18   [provider]  B Complex-Biotin-FA (B-COMPLEX PO) Take 1 tablet by mouth daily.    [provider]  buPROPion (WELLBUTRIN XL) 150 MG 24 hr tablet Take 450 mg by mouth daily. 05/05/18   [provider]  Canagliflozin-metFORMIN HCl 50-500 MG TABS Take 1 tablet by mouth 2 (two) times daily.    [provider]  carisoprodol (SOMA) 350 MG tablet Take 350 mg by mouth daily as needed. For pain in back    [provider]  DULoxetine (CYMBALTA) 30 MG capsule Take 1 capsule by mouth daily. 05/16/18   [provider]  FLUoxetine (PROZAC) 40 MG capsule Take 80 mg by mouth daily. 04/06/18   [provider]  gabapentin (NEURONTIN) 800 MG tablet Take 1 tablet by mouth 4 (four) times daily. 05/09/18   [provider]  glipiZIDE (GLUCOTROL) 5 MG tablet Take 1 tablet by mouth 2 (two) times daily as needed. 04/27/18   [provider]  lisinopril (PRINIVIL,ZESTRIL) 20 MG tablet Take 20 mg by mouth daily. 05/10/18   [provider]  meloxicam (MOBIC) 15 MG tablet Take 15 mg by mouth daily.    [provider]  metFORMIN (GLUCOPHAGE) 1000 MG tablet Take 1,000 mg by mouth 2 (two) times daily. 02/11/18   [provider]  methocarbamol (ROBAXIN) 750 MG tablet Take 1 tablet by mouth 4 (four) times daily as needed. 04/26/18   [provider]  methylcellulose (ARTIFICIAL TEARS) 1 % ophthalmic solution Place 1 drop into both eyes daily as needed (for dry eye relief).    [provider]  pravastatin (PRAVACHOL) 20 MG tablet Take 20 mg by mouth daily. 02/11/18   [provider]  pregabalin (LYRICA) 75 MG capsule Take 75 mg by mouth 2 (two) times daily.    [provider]  tiZANidine (ZANAFLEX) 4 MG  tablet Take 4 mg by mouth 3 (three) times daily as needed. 05/16/18   [provider]  traMADol (ULTRAM) 50 MG tablet Take 1 tablet (50 mg total) by mouth every 6 (six) hours as needed for pain. 08/06/12   Pauline Ausriplett, Tammy, PA-C    Family History No family history on file.  Social History Social History   Tobacco Use  . Smoking status: Never Smoker  . Smokeless tobacco: Never Used  Substance Use Topics  . Alcohol use: Yes    Comment: occ  . Drug use: No     Allergies   Bee venom; Codeine; and Darvocet [propoxyphene n-acetaminophen]   Review of Systems Review of Systems  Constitutional: Negative for appetite change.  HENT: Negative for congestion.   Respiratory: Positive for shortness of breath.   Cardiovascular:  Positive for chest pain.  Gastrointestinal: Positive for abdominal pain.  Genitourinary: Negative for flank pain.  Musculoskeletal: Negative for back pain.       Left wrist and left knee pain.  Skin: Negative for wound.  Neurological: Positive for headaches.  Psychiatric/Behavioral: Negative for confusion.     Physical Exam Updated Vital Signs BP (!) 128/103 (BP Location: Right Arm)   Pulse 76   Temp 97.9 F (36.6 C) (Temporal)   Resp 16   Ht 5\' 6"  (1.676 m)   Wt 122.5 kg   LMP  (LMP Unknown) Comment: PERIMENOPAUSAL/TUBAL LIGATION IN 1992  SpO2 99%   BMI 43.58 kg/m   Physical Exam Vitals signs and nursing note reviewed.  HENT:     Head:     Comments: Tenderness to left temporal area.    Mouth/Throat:     Mouth: Mucous membranes are moist.  Eyes:     Extraocular Movements: Extraocular movements intact.  Neck:     Musculoskeletal: Neck supple.     Comments: No midline tenderness. Cardiovascular:     Rate and Rhythm: Regular rhythm.  Pulmonary:     Comments: Tenderness to left lateral lower chest wall.  No crepitance.  No subcu emphysema. Chest:     Chest wall: Tenderness present.  Abdominal:     Comments: Tenderness to left upper and left mid abdomen.  Bruising to left lateral mid abdomen.  Musculoskeletal:        General: Tenderness present.     Comments: Tenderness over left wrist with some swelling.  Tenderness over snuffbox.  Strong radial pulse but does have paresthesias or decreased sensation over entire left hand.  Skin:    General: Skin is warm.     Capillary Refill: Capillary refill takes less than 2 seconds.  Neurological:     Mental Status: She is alert and oriented to person, place, and time.      ED Treatments / Results  Labs (all labs ordered are listed, but only abnormal results are displayed) Labs Reviewed  BASIC METABOLIC PANEL - Abnormal; Notable for the following components:      Result Value   Sodium 134 (*)    Glucose, Bld 148 (*)     All other components within normal limits  CBC    EKG None  Radiology No results found.  Procedures Procedures (including critical care time)  Medications Ordered in ED Medications - No data to display   Initial Impression / Assessment and Plan / ED Course  I have reviewed the triage vital signs and the nursing notes.  Pertinent labs & imaging results that were available during my care of the patient were reviewed  by me and considered in my medical decision making (see chart for details).        Patient with fall.  Had chest and abdominal pain.  CT scans pending.  Also left wrist and left knee pain.  Lab work reassuring.  Care will be turned over to Dr. Lynelle Doctor.  Final Clinical Impressions(s) / ED Diagnoses   Final diagnoses:  Fall, initial encounter    ED Discharge Orders    None       Benjiman Core, MD 05/30/18 1521

## 2018-05-30 NOTE — Discharge Instructions (Addendum)
Take the Medications as needed for pain, follow-up with your orthopedic doctor for further evaluation of the wrist injury

## 2018-06-06 ENCOUNTER — Other Ambulatory Visit: Payer: Self-pay

## 2018-06-06 ENCOUNTER — Ambulatory Visit: Payer: Medicare Other | Admitting: Orthopaedic Surgery

## 2018-06-06 ENCOUNTER — Encounter: Payer: Self-pay | Admitting: Orthopaedic Surgery

## 2018-06-06 VITALS — BP 112/72 | HR 64 | Temp 96.8°F | Ht 66.0 in | Wt 278.0 lb

## 2018-06-06 DIAGNOSIS — S62101A Fracture of unspecified carpal bone, right wrist, initial encounter for closed fracture: Secondary | ICD-10-CM

## 2018-06-06 DIAGNOSIS — Z6841 Body Mass Index (BMI) 40.0 and over, adult: Secondary | ICD-10-CM | POA: Diagnosis not present

## 2018-06-06 NOTE — Progress Notes (Signed)
Subjective:    Patient ID: Lorraine Horton, female    DOB: Nov 13, 1960, 58 y.o.   MRN: [id number]  HPI She fell off a bar 42 inches high and landed on her left wrist 05-30-2018.  She went to the ER. X-rays showed: IMPRESSION: Small acute dorsal avulsion fracture, likely arising from the triquetrum.  She had no other injury.  She was given a splint and referred here.  She has done well with the splint. She has some tenderness and pain is controlled.  I have reviewed the ER record, the x-rays and report.   Review of Systems  Constitutional: Positive for activity change.  Musculoskeletal: Positive for arthralgias, back pain and joint swelling.  All other systems reviewed and are negative.  For Review of Systems, all other systems reviewed and are negative.  The following is a summary of the past history medically, past history surgically, known current medicines, social history and family history.  This information is gathered electronically by the computer from prior information and documentation.  I review this each visit and have found including this information at this point in the chart is beneficial and informative.   Past Medical History:  Diagnosis Date  . AC (acromioclavicular) joint bone spurs   . Bursitis     Past Surgical History:  Procedure Laterality Date  . CHOLECYSTECTOMY    . KNEE SURGERY    . TUBAL LIGATION      Current Outpatient Medications on File Prior to Visit  Medication Sig Dispense Refill  . aspirin 81 MG chewable tablet Chew 1 tablet by mouth daily.    Marland Kitchen atorvastatin (LIPITOR) 20 MG tablet Take 20 mg by mouth daily.    Marland Kitchen buPROPion (WELLBUTRIN XL) 150 MG 24 hr tablet Take 450 mg by mouth daily.    . Canagliflozin-metFORMIN HCl 50-500 MG TABS Take 1 tablet by mouth 2 (two) times daily.    . carisoprodol (SOMA) 350 MG tablet Take 350 mg by mouth daily as needed. For pain in back    . DULoxetine (CYMBALTA) 30 MG capsule Take 1 capsule by mouth daily.     Marland Kitchen FLUoxetine (PROZAC) 40 MG capsule Take 80 mg by mouth daily.    Marland Kitchen gabapentin (NEURONTIN) 800 MG tablet Take 1 tablet by mouth 4 (four) times daily.    Marland Kitchen glipiZIDE (GLUCOTROL) 5 MG tablet Take 1 tablet by mouth 2 (two) times daily as needed.    Marland Kitchen lisinopril (PRINIVIL,ZESTRIL) 20 MG tablet Take 20 mg by mouth daily.    . meloxicam (MOBIC) 15 MG tablet Take 15 mg by mouth daily.    Marland Kitchen tiZANidine (ZANAFLEX) 4 MG tablet Take 4 mg by mouth 3 (three) times daily as needed.    . traMADol (ULTRAM) 50 MG tablet Take 1 tablet (50 mg total) by mouth every 6 (six) hours as needed. 20 tablet 0   No current facility-administered medications on file prior to visit.     Social History   Socioeconomic History  . Marital status: Widowed    Spouse name: Not on file  . Number of children: Not on file  . Years of education: Not on file  . Highest education level: Not on file  Occupational History  . Not on file  Social Needs  . Financial resource strain: Not on file  . Food insecurity:    Worry: Not on file    Inability: Not on file  . Transportation needs:    Medical: Not on file  Non-medical: Not on file  Tobacco Use  . Smoking status: Never Smoker  . Smokeless tobacco: Never Used  Substance and Sexual Activity  . Alcohol use: Yes    Comment: occ  . Drug use: No  . Sexual activity: Yes    Birth control/protection: Surgical  Lifestyle  . Physical activity:    Days per week: Not on file    Minutes per session: Not on file  . Stress: Not on file  Relationships  . Social connections:    Talks on phone: Not on file    Gets together: Not on file    Attends religious service: Not on file    Active member of club or organization: Not on file    Attends meetings of clubs or organizations: Not on file    Relationship status: Not on file  . Intimate partner violence:    Fear of current or ex partner: Not on file    Emotionally abused: Not on file    Physically abused: Not on file     Forced sexual activity: Not on file  Other Topics Concern  . Not on file  Social History Narrative  . Not on file    Family History  Problem Relation Age of Onset  . Stroke Mother   . High blood pressure Mother   . High blood pressure Father   . Diabetes Father     BP 112/72   Pulse 64   Ht 5\' 6"  (1.676 m)   Wt 278 lb (126.1 kg)   LMP  (LMP Unknown) Comment: PERIMENOPAUSAL/TUBAL LIGATION IN 1992  BMI 44.87 kg/m   Body mass index is 44.87 kg/m.      Objective:   Physical Exam Vitals signs reviewed.  Constitutional:      Appearance: She is well-developed.  HENT:     Head: Normocephalic and atraumatic.  Eyes:     Conjunctiva/sclera: Conjunctivae normal.     Pupils: Pupils are equal, round, and reactive to light.  Neck:     Musculoskeletal: Normal range of motion and neck supple.  Cardiovascular:     Rate and Rhythm: Normal rate and regular rhythm.  Pulmonary:     Effort: Pulmonary effort is normal.  Abdominal:     Palpations: Abdomen is soft.  Musculoskeletal:     Left wrist: She exhibits decreased range of motion and tenderness.       Arms:  Skin:    General: Skin is warm and dry.  Neurological:     Mental Status: She is alert and oriented to person, place, and time.     Cranial Nerves: No cranial nerve deficit.     Motor: No abnormal muscle tone.     Coordination: Coordination normal.     Deep Tendon Reflexes: Reflexes are normal and symmetric. Reflexes normal.  Psychiatric:        Behavior: Behavior normal.        Thought Content: Thought content normal.        Judgment: Judgment normal.           Assessment & Plan:   Encounter Diagnoses  Name Primary?  . Closed nondisplaced fracture of carpal bone of right wrist, unspecified carpal bone, initial encounter Yes  . Body mass index 40.0-44.9, adult (HCC)   . Morbid obesity (HCC)    She is to continue the splint from the ER.  Return in one month.  Call if any problem. S-rays on return.   Precautions discussed.   Electronically  Signed Darreld Mclean, MD 3/25/20209:27 AM

## 2018-07-04 ENCOUNTER — Encounter: Payer: Self-pay | Admitting: Orthopaedic Surgery

## 2018-07-04 ENCOUNTER — Ambulatory Visit (INDEPENDENT_AMBULATORY_CARE_PROVIDER_SITE_OTHER): Payer: Medicare Other

## 2018-07-04 ENCOUNTER — Other Ambulatory Visit: Payer: Self-pay

## 2018-07-04 ENCOUNTER — Ambulatory Visit (INDEPENDENT_AMBULATORY_CARE_PROVIDER_SITE_OTHER): Payer: Medicare Other | Admitting: Orthopaedic Surgery

## 2018-07-04 VITALS — Ht 66.0 in | Wt 278.0 lb

## 2018-07-04 DIAGNOSIS — S62101D Fracture of unspecified carpal bone, right wrist, subsequent encounter for fracture with routine healing: Secondary | ICD-10-CM

## 2018-07-04 DIAGNOSIS — S62102D Fracture of unspecified carpal bone, left wrist, subsequent encounter for fracture with routine healing: Secondary | ICD-10-CM

## 2018-07-04 NOTE — Progress Notes (Signed)
CC:  My wrist is not as sore  She has been using the cock-up splint on the left.  She has much less pain.  NV intact. ROM is full of the wrist.  X-rays were done of the left wrist, reported separately.  Encounter Diagnosis  Name Primary?  . Closed nondisplaced fracture of carpal bone of right wrist with routine healing, subsequent encounter Yes   Come out of cock-up splint gradually.  Virtual visit in three weeks.  Call if any problem.  Precautions discussed.   Electronically Signed Darreld Mclean, MD 4/22/20209:57 AM

## 2018-07-25 ENCOUNTER — Other Ambulatory Visit: Payer: Self-pay

## 2018-07-25 ENCOUNTER — Ambulatory Visit: Payer: Self-pay | Admitting: Orthopaedic Surgery

## 2018-07-25 ENCOUNTER — Encounter: Payer: Self-pay | Admitting: Orthopaedic Surgery

## 2018-07-25 ENCOUNTER — Ambulatory Visit (INDEPENDENT_AMBULATORY_CARE_PROVIDER_SITE_OTHER): Payer: Medicare Other | Admitting: Orthopaedic Surgery

## 2018-07-25 DIAGNOSIS — S62101D Fracture of unspecified carpal bone, right wrist, subsequent encounter for fracture with routine healing: Secondary | ICD-10-CM

## 2018-07-25 NOTE — Progress Notes (Signed)
Virtual Visit via Telephone Note  I connected with Lorraine Horton on 07/25/18 at  9:40 AM EDT by telephone and verified that I am speaking with the correct person using two identifiers.  Location: Patient: home Provider: home   I discussed the limitations, risks, security and privacy concerns of performing an evaluation and management service by telephone and the availability of in person appointments. I also discussed with the patient that there may be a patient responsible charge related to this service. The patient expressed understanding and agreed to proceed.   History of Present Illness: She is doing well.  She has no problem with the wrist.  She is doing normal activity   Observations/Objective: Per above  Assessment and Plan: Encounter Diagnosis  Name Primary?  . Closed nondisplaced fracture of carpal bone of right wrist with routine healing, subsequent encounter Yes    Follow Up Instructions: PRN   I discussed the assessment and treatment plan with the patient. The patient was provided an opportunity to ask questions and all were answered. The patient agreed with the plan and demonstrated an understanding of the instructions.   The patient was advised to call back or seek an in-person evaluation if the symptoms worsen or if the condition fails to improve as anticipated.  I provided 4 minutes of non-face-to-face time during this encounter.   Darreld Mclean, MD

## 2018-07-26 ENCOUNTER — Ambulatory Visit: Payer: Medicare Other | Admitting: Orthopaedic Surgery

## 2018-08-09 DIAGNOSIS — M5412 Radiculopathy, cervical region: Secondary | ICD-10-CM | POA: Diagnosis not present

## 2018-08-09 DIAGNOSIS — M542 Cervicalgia: Secondary | ICD-10-CM | POA: Diagnosis not present

## 2018-09-23 ENCOUNTER — Emergency Department (HOSPITAL_COMMUNITY): Payer: Medicare Other

## 2018-09-23 ENCOUNTER — Emergency Department (HOSPITAL_COMMUNITY)
Admission: EM | Admit: 2018-09-23 | Discharge: 2018-09-23 | Disposition: A | Discharge status: Home / Self Care | Payer: Medicare Other | Attending: Emergency Medicine | Admitting: Emergency Medicine

## 2018-09-23 ENCOUNTER — Other Ambulatory Visit: Payer: Self-pay

## 2018-09-23 ENCOUNTER — Encounter (HOSPITAL_COMMUNITY): Payer: Self-pay | Admitting: *Deleted

## 2018-09-23 DIAGNOSIS — W010XXA Fall on same level from slipping, tripping and stumbling without subsequent striking against object, initial encounter: Secondary | ICD-10-CM | POA: Diagnosis not present

## 2018-09-23 DIAGNOSIS — Z79899 Other long term (current) drug therapy: Secondary | ICD-10-CM | POA: Diagnosis not present

## 2018-09-23 DIAGNOSIS — Y9302 Activity, running: Secondary | ICD-10-CM | POA: Insufficient documentation

## 2018-09-23 DIAGNOSIS — Z7982 Long term (current) use of aspirin: Secondary | ICD-10-CM | POA: Diagnosis not present

## 2018-09-23 DIAGNOSIS — S7002XA Contusion of left hip, initial encounter: Secondary | ICD-10-CM | POA: Diagnosis not present

## 2018-09-23 DIAGNOSIS — S76912A Strain of unspecified muscles, fascia and tendons at thigh level, left thigh, initial encounter: Secondary | ICD-10-CM

## 2018-09-23 DIAGNOSIS — Y999 Unspecified external cause status: Secondary | ICD-10-CM | POA: Insufficient documentation

## 2018-09-23 DIAGNOSIS — S7012XA Contusion of left thigh, initial encounter: Secondary | ICD-10-CM

## 2018-09-23 DIAGNOSIS — S79912A Unspecified injury of left hip, initial encounter: Secondary | ICD-10-CM | POA: Diagnosis not present

## 2018-09-23 DIAGNOSIS — Y92019 Unspecified place in single-family (private) house as the place of occurrence of the external cause: Secondary | ICD-10-CM | POA: Diagnosis not present

## 2018-09-23 DIAGNOSIS — Z7984 Long term (current) use of oral hypoglycemic drugs: Secondary | ICD-10-CM | POA: Diagnosis not present

## 2018-09-23 DIAGNOSIS — M25562 Pain in left knee: Secondary | ICD-10-CM | POA: Diagnosis not present

## 2018-09-23 DIAGNOSIS — M25552 Pain in left hip: Secondary | ICD-10-CM | POA: Diagnosis not present

## 2018-09-23 MED ORDER — TRAMADOL HCL 50 MG PO TABS
100.0000 mg | ORAL_TABLET | Freq: Once | ORAL | Status: AC
  Administered 2018-09-23: 100 mg via ORAL
  Filled 2018-09-23: qty 2

## 2018-09-23 MED ORDER — MORPHINE SULFATE (PF) 10 MG/ML IV SOLN
10.0000 mg | Freq: Once | INTRAVENOUS | Status: AC
  Administered 2018-09-23: 10 mg via INTRAMUSCULAR
  Filled 2018-09-23: qty 1

## 2018-09-23 MED ORDER — ONDANSETRON HCL 4 MG PO TABS
4.0000 mg | ORAL_TABLET | Freq: Once | ORAL | Status: AC
  Administered 2018-09-23: 4 mg via ORAL
  Filled 2018-09-23: qty 1

## 2018-09-23 MED ORDER — CYCLOBENZAPRINE HCL 10 MG PO TABS
10.0000 mg | ORAL_TABLET | Freq: Once | ORAL | Status: AC
  Administered 2018-09-23: 21:00:00 10 mg via ORAL
  Filled 2018-09-23: qty 1

## 2018-09-23 MED ORDER — ONDANSETRON HCL 4 MG PO TABS
4.0000 mg | ORAL_TABLET | Freq: Once | ORAL | Status: AC
  Administered 2018-09-23: 23:00:00 4 mg via ORAL
  Filled 2018-09-23: qty 1

## 2018-09-23 NOTE — ED Triage Notes (Signed)
Pt fell yesterday while running and now has pain to left leg, left hip and left shoulder. Hit head on floor, denies LOC.

## 2018-09-23 NOTE — ED Provider Notes (Signed)
[facility] EMERGENCY DEPARTMENT Provider Note   CSN: [id number] Arrival date & time: 09/23/18  1951     History   Chief Complaint Chief Complaint  Patient presents with  . Fall    HPI Lorraine Horton is a 58 y.o. female.     Patient is a 58 year old female who presents to the emergency department with a complaint of injuries after a fall.  The patient states that on yesterday, July 11, she was running through the house when she lost her footing, and fell.  She says that her left leg went under her, and she injured her left leg, left knee, and left hip.  The patient states that she hit her head, but did not lose consciousness, have any dizziness, or any change in balance or speech.  The patient states that she has degenerative disc disease at L4-L5.  She is in a pain management clinic and was able to take some of her Ultram for this pain.  She was able to go to sleep on last evening.  She did not seek medical advice on last evening.  Today the pain continued and she says may have been actually worse and so she came to the emergency department for evaluation.  The patient denies being on any anticoagulation medications.  She is not had any recent operations or procedures involving the left leg, knee, or hip.  No altered mental status reported.  No nausea, and no vomiting reported.   Fall Pertinent negatives include no chest pain, no abdominal pain and no shortness of breath.    Past Medical History:  Diagnosis Date  . AC (acromioclavicular) joint bone spurs   . Bursitis     There are no active problems to display for this patient.   Past Surgical History:  Procedure Laterality Date  . CHOLECYSTECTOMY    . KNEE SURGERY    . TUBAL LIGATION       OB History   No obstetric history on file.      Home Medications    Prior to Admission medications   Medication Sig Start Date End Date Taking? Authorizing Provider  aspirin 81 MG chewable tablet Chew 1 tablet by mouth  daily.    [provider]  atorvastatin (LIPITOR) 20 MG tablet Take 20 mg by mouth daily. 04/27/18   [provider]  buPROPion (WELLBUTRIN XL) 150 MG 24 hr tablet Take 450 mg by mouth daily. 05/05/18   [provider]  Canagliflozin-metFORMIN HCl 50-500 MG TABS Take 1 tablet by mouth 2 (two) times daily.    [provider]  carisoprodol (SOMA) 350 MG tablet Take 350 mg by mouth daily as needed. For pain in back    [provider]  DULoxetine (CYMBALTA) 30 MG capsule Take 1 capsule by mouth daily. 05/16/18   [provider]  FLUoxetine (PROZAC) 40 MG capsule Take 80 mg by mouth daily. 04/06/18   [provider]  gabapentin (NEURONTIN) 800 MG tablet Take 1 tablet by mouth 4 (four) times daily. 05/09/18   [provider]  glipiZIDE (GLUCOTROL) 5 MG tablet Take 1 tablet by mouth 2 (two) times daily as needed. 04/27/18   [provider]  lisinopril (PRINIVIL,ZESTRIL) 20 MG tablet Take 20 mg by mouth daily. 05/10/18   [provider]  meloxicam (MOBIC) 15 MG tablet Take 15 mg by mouth daily.    [provider]  tiZANidine (ZANAFLEX) 4 MG tablet Take 4 mg by mouth 3 (three) times  daily as needed. 05/16/18   [provider]  traMADol (ULTRAM) 50 MG tablet Take 1 tablet (50 mg total) by mouth every 6 (six) hours as needed. 05/30/18   Linwood DibblesKnapp, Jon, MD    Family History Family History  Problem Relation Age of Onset  . Stroke Mother   . High blood pressure Mother   . High blood pressure Father   . Diabetes Father     Social History Social History   Tobacco Use  . Smoking status: Never Smoker  . Smokeless tobacco: Never Used  Substance Use Topics  . Alcohol use: Yes    Comment: occ  . Drug use: No     Allergies   Bee venom, Codeine, and Darvocet [propoxyphene n-acetaminophen]   Review of Systems Review of Systems  Constitutional: Negative for activity change and appetite change.  HENT:  Negative for congestion, ear discharge, ear pain, facial swelling, nosebleeds, rhinorrhea, sneezing and tinnitus.   Eyes: Negative for photophobia, pain and discharge.  Respiratory: Negative for cough, choking, shortness of breath and wheezing.   Cardiovascular: Negative for chest pain, palpitations and leg swelling.  Gastrointestinal: Negative for abdominal pain, blood in stool, constipation, diarrhea, nausea and vomiting.  Genitourinary: Negative for difficulty urinating, dysuria, flank pain, frequency and hematuria.  Musculoskeletal: Positive for arthralgias and back pain. Negative for gait problem, myalgias and neck pain.  Skin: Negative for color change, rash and wound.  Neurological: Negative for dizziness, seizures, syncope, facial asymmetry, speech difficulty, weakness and numbness.  Hematological: Negative for adenopathy. Does not bruise/bleed easily.  Psychiatric/Behavioral: Negative for agitation, confusion, hallucinations, self-injury and suicidal ideas. The patient is not nervous/anxious.      Physical Exam Updated Vital Signs BP (!) 104/55 (BP Location: Right Arm)   Pulse 69   Temp 97.6 F (36.4 C) (Oral)   Resp 20   LMP  (LMP Unknown) Comment: PERIMENOPAUSAL/TUBAL LIGATION IN 1992  SpO2 98%   Physical Exam Vitals signs and nursing note reviewed.  Constitutional:      General: She is not in acute distress.    Appearance: She is well-developed.  HENT:     Head: Normocephalic and atraumatic.     Right Ear: External ear normal.     Left Ear: External ear normal.  Eyes:     General: No scleral icterus.       Right eye: No discharge.        Left eye: No discharge.     Conjunctiva/sclera: Conjunctivae normal.  Neck:     Musculoskeletal: Neck supple.     Trachea: No tracheal deviation.  Cardiovascular:     Rate and Rhythm: Normal rate and regular rhythm.  Pulmonary:     Effort: Pulmonary effort is normal. No respiratory distress.     Breath sounds: Normal breath  sounds. No stridor. No wheezing or rales.  Abdominal:     General: Bowel sounds are normal. There is no distension.     Palpations: Abdomen is soft.     Tenderness: There is no abdominal tenderness. There is no guarding or rebound.  Musculoskeletal:     Left shoulder: She exhibits tenderness. She exhibits normal range of motion and no deformity.     Left hip: She exhibits decreased range of motion and tenderness.     Left knee: Tenderness found. Lateral joint line tenderness noted.  Skin:    General: Skin is warm and dry.     Findings: No rash.  Neurological:     Mental Status:  She is alert.     Cranial Nerves: No cranial nerve deficit (no facial droop, extraocular movements intact, no slurred speech).     Sensory: No sensory deficit.     Motor: No abnormal muscle tone or seizure activity.     Coordination: Coordination normal.      ED Treatments / Results  Labs (all labs ordered are listed, but only abnormal results are displayed) Labs Reviewed - No data to display  EKG None  Radiology No results found.  Procedures Procedures (including critical care time)  Medications Ordered in ED Medications - No data to display   Initial Impression / Assessment and Plan / ED Course  I have reviewed the triage vital signs and the nursing notes.  Pertinent labs & imaging results that were available during my care of the patient were reviewed by me and considered in my medical decision making (see chart for details).          Final Clinical Impressions(s) / ED Diagnoses MDM  Vital signs reviewed.  Pulse oximetry is 98% on room air.  Within normal limits by my interpretation.  Patient sustained multiple injuries following a fall.  Will obtain left hip, pelvis, and knee films.  Muscle relaxer and pain medication given to the patient to assist with her discomfort.  X-ray of the right hip shows no acute fracture or dislocation.  X-ray of the pelvis is also negative for fracture  or dislocation.  The patient is noted to have degenerative changes of the hip joint.  There is some spurring noted of the acetabulum plate. Pt continues to have pain. IM Morphine given to the pt.  I discussed these findings with the patient in terms of which he understands.  Have asked the patient to continue her pain management medications.  I have asked her to discuss these increasing pains with her pain management specialist as well as her primary physician.  The patient is to return to the emergency department if any emergent changes in her condition, problems, or concerns.   Final diagnoses:  Contusion of left hip and thigh, initial encounter  Muscle strain of left thigh, initial encounter    ED Discharge Orders    None       Ivery QualeBryant, Skyylar Kopf, PA-C 09/24/18 1347    Pricilla LovelessGoldston, Scott, MD 09/27/18 [phone]

## 2018-09-23 NOTE — Discharge Instructions (Addendum)
Your vital signs are within normal limits.  Your x-ray is negative for fracture or dislocation.  Your examination suggest possible bruise to the hip and muscle strain involving the thigh.  These situations are probably aggravated the degenerative disc disease issue that you are being seen for already.  Heating pad to the area may be helpful.  Please continue your current medications.  Please report this fall and the symptoms to your pain management specialist so that adjustments can be made if needed.  You were given an injection of narcotic pain medication here in the emergency department please use caution changing positions and getting up and down during the night tonight.

## 2018-10-05 DIAGNOSIS — M25562 Pain in left knee: Secondary | ICD-10-CM | POA: Diagnosis not present

## 2018-10-05 DIAGNOSIS — Z1389 Encounter for screening for other disorder: Secondary | ICD-10-CM | POA: Diagnosis not present

## 2018-10-05 DIAGNOSIS — S76192A Other specified injury of left quadriceps muscle, fascia and tendon, initial encounter: Secondary | ICD-10-CM | POA: Diagnosis not present

## 2018-11-01 DIAGNOSIS — E1140 Type 2 diabetes mellitus with diabetic neuropathy, unspecified: Secondary | ICD-10-CM | POA: Diagnosis not present

## 2018-11-01 DIAGNOSIS — E7849 Other hyperlipidemia: Secondary | ICD-10-CM | POA: Diagnosis not present

## 2018-11-01 DIAGNOSIS — E119 Type 2 diabetes mellitus without complications: Secondary | ICD-10-CM | POA: Diagnosis not present

## 2018-11-01 DIAGNOSIS — I10 Essential (primary) hypertension: Secondary | ICD-10-CM | POA: Diagnosis not present

## 2018-11-06 DIAGNOSIS — M1611 Unilateral primary osteoarthritis, right hip: Secondary | ICD-10-CM | POA: Diagnosis not present

## 2018-11-06 DIAGNOSIS — I10 Essential (primary) hypertension: Secondary | ICD-10-CM | POA: Diagnosis not present

## 2018-11-06 DIAGNOSIS — M545 Low back pain: Secondary | ICD-10-CM | POA: Diagnosis not present

## 2018-11-23 DIAGNOSIS — M545 Low back pain: Secondary | ICD-10-CM | POA: Diagnosis not present

## 2018-11-23 DIAGNOSIS — M542 Cervicalgia: Secondary | ICD-10-CM | POA: Diagnosis not present

## 2019-01-30 DIAGNOSIS — E119 Type 2 diabetes mellitus without complications: Secondary | ICD-10-CM | POA: Diagnosis not present

## 2019-02-14 ENCOUNTER — Other Ambulatory Visit: Payer: Self-pay | Admitting: *Deleted

## 2019-02-14 NOTE — Patient Outreach (Signed)
[facility]) Care Management  02/14/2019  Lorraine Horton 12/01/1960 [id number]   RN Health Coach attempted screening #1 outreach call to patient.  Patient was unavailable. HIPPA compliance voicemail message left with return callback number.  Plan: RN will call patient again within 30 days.  [facility] [phone]

## 2019-02-20 DIAGNOSIS — I10 Essential (primary) hypertension: Secondary | ICD-10-CM | POA: Diagnosis not present

## 2019-02-20 DIAGNOSIS — M542 Cervicalgia: Secondary | ICD-10-CM | POA: Diagnosis not present

## 2019-02-20 DIAGNOSIS — M545 Low back pain: Secondary | ICD-10-CM | POA: Diagnosis not present

## 2019-02-22 ENCOUNTER — Ambulatory Visit: Payer: Self-pay | Admitting: *Deleted

## 2019-02-25 ENCOUNTER — Other Ambulatory Visit: Payer: Self-pay | Admitting: *Deleted

## 2019-02-25 NOTE — Patient Outreach (Addendum)
[facility]) Care Management  02/25/2019  Lorraine Horton 09-27-1960 [id number]   RN Health Coach attempted follow up screening outreach call to patient.  Patient was unavailable. HIPPA compliance voicemail message left with return callback number.  Plan: RN will call patient again within 10 business days. Unsuccessful out reach letter sent  [facility] [phone]

## 2019-03-20 ENCOUNTER — Other Ambulatory Visit: Payer: Self-pay

## 2019-03-20 ENCOUNTER — Ambulatory Visit: Payer: Medicare Other | Attending: Internal Medicine

## 2019-03-20 DIAGNOSIS — Z20822 Contact with and (suspected) exposure to covid-19: Secondary | ICD-10-CM

## 2019-03-22 LAB — NOVEL CORONAVIRUS, NAA: SARS-CoV-2, NAA: NOT DETECTED

## 2019-03-29 ENCOUNTER — Other Ambulatory Visit: Payer: Self-pay | Admitting: *Deleted

## 2019-03-29 NOTE — Patient Outreach (Addendum)
[facility]) Care Management  03/29/2019  Lorraine Horton [birth year] [id number]   Multiple attempts to establish contact with patient without success. No response from letter mailed to patient. Case is being closed at this time.   Plan: RN Health Coach will close case based on inability to establish contact with patient.   Gean Maidens BSN RN [facility] Care Management [phone]

## 2019-04-19 DIAGNOSIS — M79602 Pain in left arm: Secondary | ICD-10-CM | POA: Diagnosis not present

## 2019-04-19 DIAGNOSIS — I10 Essential (primary) hypertension: Secondary | ICD-10-CM | POA: Diagnosis not present

## 2019-04-19 DIAGNOSIS — E7849 Other hyperlipidemia: Secondary | ICD-10-CM | POA: Diagnosis not present

## 2019-04-19 DIAGNOSIS — R251 Tremor, unspecified: Secondary | ICD-10-CM | POA: Diagnosis not present

## 2019-04-25 ENCOUNTER — Ambulatory Visit: Payer: Medicare Other

## 2019-04-25 ENCOUNTER — Other Ambulatory Visit: Payer: Self-pay

## 2019-04-25 ENCOUNTER — Encounter: Payer: Self-pay | Admitting: Orthopaedic Surgery

## 2019-04-25 ENCOUNTER — Ambulatory Visit: Payer: Medicare Other | Admitting: Orthopaedic Surgery

## 2019-04-25 VITALS — BP 105/73 | HR 71 | Temp 97.2°F | Ht 66.0 in | Wt 290.0 lb

## 2019-04-25 DIAGNOSIS — Z6841 Body Mass Index (BMI) 40.0 and over, adult: Secondary | ICD-10-CM

## 2019-04-25 DIAGNOSIS — G8929 Other chronic pain: Secondary | ICD-10-CM | POA: Diagnosis not present

## 2019-04-25 DIAGNOSIS — M25512 Pain in left shoulder: Secondary | ICD-10-CM

## 2019-04-25 MED ORDER — NAPROXEN 500 MG PO TABS: 500.0000 mg | ORAL_TABLET | Freq: Two times a day (BID) | ORAL | 5 refills | Status: DC

## 2019-04-25 NOTE — Progress Notes (Addendum)
[name], female DOB:[birth year], 59 y.o. SVX:[id number]  Chief Complaint  Patient presents with  . Shoulder Pain    Lt shoulder down to elbow getting worse in past 2 mos.    HPI  Lorraine Horton is a 59 y.o. female who has developed left shoulder pain over the last four to six weeks getting progressively worse.  She has no recent trauma. She has no paresthesias.  She has pain trying to raise her arm over head. She has pain sleeping on it.  She has no redness or swelling.  She takes Tylenol and Tramadol with no help.   Body mass index is 46.81 kg/m.  The patient meets the AMA guidelines for Morbid (severe) obesity with a BMI > 40.0 and I have recommended weight loss.   ROS  Review of Systems  Constitutional: Positive for activity change.  Musculoskeletal: Positive for arthralgias, back pain and joint swelling.  All other systems reviewed and are negative.   All other systems reviewed and are negative.  The following is a summary of the past history medically, past history surgically, known current medicines, social history and family history.  This information is gathered electronically by the computer from prior information and documentation.  I review this each visit and have found including this information at this point in the chart is beneficial and informative.    Past Medical History:  Diagnosis Date  . AC (acromioclavicular) joint bone spurs   . Bursitis     Past Surgical History:  Procedure Laterality Date  . CHOLECYSTECTOMY    . KNEE SURGERY    . TUBAL LIGATION      Family History  Problem Relation Age of Onset  . Stroke Mother   . High blood pressure Mother   . High blood pressure Father   . Diabetes Father     Social History Social History   Tobacco Use  . Smoking status: Never Smoker  . Smokeless tobacco: Never Used  Substance Use Topics  . Alcohol use: Yes    Comment: occ  . Drug use: No    Allergies  Allergen Reactions   . Bee Venom Shortness Of Breath and Swelling  . Codeine Nausea And Vomiting and Other (See Comments)    REACTION: Passes out in addition to nausea and vomiting  . Darvocet [Propoxyphene N-Acetaminophen] Nausea And Vomiting and Other (See Comments)    REACTION: passes out in addition to nausea and vomiting    Current Outpatient Medications  Medication Sig Dispense Refill  . aspirin 81 MG chewable tablet Chew 1 tablet by mouth daily.    Marland Kitchen atorvastatin (LIPITOR) 20 MG tablet Take 20 mg by mouth daily.    Marland Kitchen buPROPion (WELLBUTRIN XL) 150 MG 24 hr tablet Take 450 mg by mouth daily.    . Canagliflozin-metFORMIN HCl 50-500 MG TABS Take 1 tablet by mouth 2 (two) times daily.    . carisoprodol (SOMA) 350 MG tablet Take 350 mg by mouth daily as needed. For pain in back    . DULoxetine (CYMBALTA) 30 MG capsule Take 1 capsule by mouth daily.    Marland Kitchen FLUoxetine (PROZAC) 40 MG capsule Take 80 mg by mouth daily.    Marland Kitchen gabapentin (NEURONTIN) 800 MG tablet Take 1 tablet by mouth 4 (four) times daily.    Marland Kitchen glipiZIDE (GLUCOTROL) 5 MG tablet Take 1 tablet by mouth 2 (two) times daily as needed.    Marland Kitchen lisinopril (PRINIVIL,ZESTRIL) 20 MG tablet Take 20 mg by mouth  daily.    . meloxicam (MOBIC) 15 MG tablet Take 15 mg by mouth daily.    . naproxen (NAPROSYN) 500 MG tablet Take 1 tablet (500 mg total) by mouth 2 (two) times daily with a meal. 60 tablet 5  . tiZANidine (ZANAFLEX) 4 MG tablet Take 4 mg by mouth 3 (three) times daily as needed.    . traMADol (ULTRAM) 50 MG tablet Take 1 tablet (50 mg total) by mouth every 6 (six) hours as needed. 20 tablet 0   No current facility-administered medications for this visit.     Physical Exam  Blood pressure 105/73, pulse 71, temperature (!) 97.2 F (36.2 C), height 5\' 6"  (1.676 m), weight 290 lb (131.5 kg).  Constitutional: overall normal hygiene, normal nutrition, well developed, normal grooming, normal body habitus. Assistive device:none  Musculoskeletal: gait  and station Limp none, muscle tone and strength are normal, no tremors or atrophy is present.  .  Neurological: coordination overall normal.  Deep tendon reflex/nerve stretch intact.  Sensation normal.  Cranial nerves II-XII intact.   Skin:   Normal overall no scars, lesions, ulcers or rashes. No psoriasis.  Psychiatric: Alert and oriented x 3.  Recent memory intact, remote memory unclear.  Normal mood and affect. Well groomed.  Good eye contact.  Cardiovascular: overall no swelling, no varicosities, no edema bilaterally, normal temperatures of the legs and arms, no clubbing, cyanosis and good capillary refill.  Lymphatic: palpation is normal.  Examination of left Upper Extremity is done.  Inspection:   Overall:  Elbow non-tender without crepitus or defects, forearm non-tender without crepitus or defects, wrist non-tender without crepitus or defects, hand non-tender.    Shoulder: with glenohumeral joint tenderness, without effusion.   Upper arm:  without swelling and tenderness   Range of motion:   Overall:  Full range of motion of the elbow, full range of motion of wrist and full range of motion in fingers.   Shoulder:  left  140 degrees forward flexion; 120 degrees abduction; 20 degrees internal rotation, 20 degrees external rotation, 5 degrees extension, 40 degrees adduction.   Stability:   Overall:  Shoulder, elbow and wrist stable   Strength and Tone:   Overall full shoulder muscles strength, full upper arm strength and normal upper arm bulk and tone.  All other systems reviewed and are negative   The patient has been educated about the nature of the problem(s) and counseled on treatment options.  The patient appeared to understand what I have discussed and is in agreement with it.  Encounter Diagnosis  Name Primary?  . Chronic left shoulder pain Yes   PROCEDURE NOTE:  The patient request injection, verbal consent was obtained.  The left shoulder was prepped appropriately  after time out was performed.   Sterile technique was observed and injection of 1 cc of Depo-Medrol 40 mg with several cc's of plain xylocaine. Anesthesia was provided by ethyl chloride and a 20-gauge needle was used to inject the shoulder area. A posterior approach was used.  The injection was tolerated well.  A band aid dressing was applied.  The patient was advised to apply ice later today and tomorrow to the injection sight as needed.    PLAN Call if any problems.  Precautions discussed.  Continue current medications. I have called in Naprosyn 500.  Return to clinic 2 weeks   Electronically Signed , MD 2/11/20218:53 AM

## 2019-05-09 ENCOUNTER — Other Ambulatory Visit: Payer: Self-pay

## 2019-05-09 ENCOUNTER — Ambulatory Visit: Payer: Medicare Other | Admitting: Orthopaedic Surgery

## 2019-05-09 ENCOUNTER — Encounter: Payer: Self-pay | Admitting: Orthopaedic Surgery

## 2019-05-09 VITALS — BP 106/70 | HR 67 | Temp 97.3°F | Ht 66.0 in | Wt 285.6 lb

## 2019-05-09 DIAGNOSIS — G8929 Other chronic pain: Secondary | ICD-10-CM | POA: Diagnosis not present

## 2019-05-09 DIAGNOSIS — I10 Essential (primary) hypertension: Secondary | ICD-10-CM | POA: Diagnosis not present

## 2019-05-09 DIAGNOSIS — M545 Low back pain: Secondary | ICD-10-CM | POA: Diagnosis not present

## 2019-05-09 DIAGNOSIS — M25512 Pain in left shoulder: Secondary | ICD-10-CM

## 2019-05-09 DIAGNOSIS — Z6841 Body Mass Index (BMI) 40.0 and over, adult: Secondary | ICD-10-CM | POA: Diagnosis not present

## 2019-05-09 DIAGNOSIS — M542 Cervicalgia: Secondary | ICD-10-CM | POA: Diagnosis not present

## 2019-05-09 NOTE — Progress Notes (Signed)
Patient VO:HYWVP Lorraine Horton, female DOB:06/14/1960, 59 y.o. [id number]:[id number]  Chief Complaint  Patient presents with  . Shoulder Problem    Left shoulder pain going down to the elbow getting worse past few months     HPI  Lorraine Horton is a 59 y.o. female who continues to have pain in the left shoulder.  The injection only helped a little. I will get MRI.  She has pain most of the time, more with overhead use.  She has no redness, no swelling, no numbness.   Body mass index is 46.1 kg/m.  ROS  Review of Systems  Constitutional: Positive for activity change.  Musculoskeletal: Positive for arthralgias, back pain and joint swelling.  All other systems reviewed and are negative.   All other systems reviewed and are negative.  The following is a summary of the past history medically, past history surgically, known current medicines, social history and family history.  This information is gathered electronically by the computer from prior information and documentation.  I review this each visit and have found including this information at this point in the chart is beneficial and informative.    Past Medical History:  Diagnosis Date  . AC (acromioclavicular) joint bone spurs   . Bursitis     Past Surgical History:  Procedure Laterality Date  . CHOLECYSTECTOMY    . KNEE SURGERY    . TUBAL LIGATION      Family History  Problem Relation Age of Onset  . Stroke Mother   . High blood pressure Mother   . High blood pressure Father   . Diabetes Father     Social History Social History   Tobacco Use  . Smoking status: Never Smoker  . Smokeless tobacco: Never Used  Substance Use Topics  . Alcohol use: Yes    Comment: occ  . Drug use: No    Allergies  Allergen Reactions  . Bee Venom Shortness Of Breath and Swelling  . Codeine Nausea And Vomiting and Other (See Comments)    REACTION: Passes out in addition to nausea and vomiting  . Darvocet [Propoxyphene  N-Acetaminophen] Nausea And Vomiting and Other (See Comments)    REACTION: passes out in addition to nausea and vomiting    Current Outpatient Medications  Medication Sig Dispense Refill  . aspirin 81 MG chewable tablet Chew 1 tablet by mouth daily.    Marland Kitchen atorvastatin (LIPITOR) 20 MG tablet Take 20 mg by mouth daily.    Marland Kitchen buPROPion (WELLBUTRIN XL) 150 MG 24 hr tablet Take 450 mg by mouth daily.    . Canagliflozin-metFORMIN HCl 50-500 MG TABS Take 1 tablet by mouth 2 (two) times daily.    . DULoxetine (CYMBALTA) 30 MG capsule Take 1 capsule by mouth daily.    Marland Kitchen FLUoxetine (PROZAC) 40 MG capsule Take 80 mg by mouth daily.    Marland Kitchen gabapentin (NEURONTIN) 800 MG tablet Take 1 tablet by mouth 4 (four) times daily.    Marland Kitchen lisinopril (PRINIVIL,ZESTRIL) 20 MG tablet Take 20 mg by mouth daily.    . meloxicam (MOBIC) 15 MG tablet Take 15 mg by mouth daily.    . naproxen (NAPROSYN) 500 MG tablet Take 1 tablet (500 mg total) by mouth 2 (two) times daily with a meal. 60 tablet 5  . tiZANidine (ZANAFLEX) 4 MG tablet Take 4 mg by mouth 3 (three) times daily as needed.    . traMADol (ULTRAM) 50 MG tablet Take 1 tablet (50 mg total) by mouth every  6 (six) hours as needed. 20 tablet 0  . carisoprodol (SOMA) 350 MG tablet Take 350 mg by mouth daily as needed. For pain in back    . glipiZIDE (GLUCOTROL) 5 MG tablet Take 1 tablet by mouth 2 (two) times daily as needed.     No current facility-administered medications for this visit.     Physical Exam  Blood pressure 106/70, pulse 67, temperature (!) 97.3 F (36.3 C), height 5\' 6"  (1.676 m), weight 285 lb 9.6 oz (129.5 kg).  Constitutional: overall normal hygiene, normal nutrition, well developed, normal grooming, normal body habitus. Assistive device:none  Musculoskeletal: gait and station Limp none, muscle tone and strength are normal, no tremors or atrophy is present.  .  Neurological: coordination overall normal.  Deep tendon reflex/nerve stretch intact.   Sensation normal.  Cranial nerves II-XII intact.   Skin:   Normal overall no scars, lesions, ulcers or rashes. No psoriasis.  Psychiatric: Alert and oriented x 3.  Recent memory intact, remote memory unclear.  Normal mood and affect. Well groomed.  Good eye contact.  Cardiovascular: overall no swelling, no varicosities, no edema bilaterally, normal temperatures of the legs and arms, no clubbing, cyanosis and good capillary refill.  Lymphatic: palpation is normal.  Examination of left Upper Extremity is done.  Inspection:   Overall:  Elbow non-tender without crepitus or defects, forearm non-tender without crepitus or defects, wrist non-tender without crepitus or defects, hand non-tender.    Shoulder: with glenohumeral joint tenderness, without effusion.   Upper arm:  without swelling and tenderness   Range of motion:   Overall:  Full range of motion of the elbow, full range of motion of wrist and full range of motion in fingers.   Shoulder:  left  140  degrees forward flexion; 120 degrees abduction; 20 degrees internal rotation, 20 degrees external rotation, 5 degrees extension, 40 degrees adduction.   Stability:   Overall:  Shoulder, elbow and wrist stable   Strength and Tone:   Overall full shoulder muscles strength, full upper arm strength and normal upper arm bulk and tone.  All other systems reviewed and are negative   The patient has been educated about the nature of the problem(s) and counseled on treatment options.  The patient appeared to understand what I have discussed and is in agreement with it.  Encounter Diagnoses  Name Primary?  . Chronic left shoulder pain Yes  . Body mass index 40.0-44.9, adult ([place])   . Morbid obesity ([place])     PLAN Call if any problems.  Precautions discussed.  Continue current medications.   Return to clinic 2 weeks   Schedule MRI of the left shoulder.  Electronically Signed Sanjuana Kava, MD 2/25/202110:09 AM

## 2019-05-10 DIAGNOSIS — G2581 Restless legs syndrome: Secondary | ICD-10-CM | POA: Diagnosis not present

## 2019-05-10 DIAGNOSIS — E118 Type 2 diabetes mellitus with unspecified complications: Secondary | ICD-10-CM | POA: Diagnosis not present

## 2019-05-10 DIAGNOSIS — I10 Essential (primary) hypertension: Secondary | ICD-10-CM | POA: Diagnosis not present

## 2019-05-10 DIAGNOSIS — Z0000 Encounter for general adult medical examination without abnormal findings: Secondary | ICD-10-CM | POA: Diagnosis not present

## 2019-05-10 DIAGNOSIS — Z1389 Encounter for screening for other disorder: Secondary | ICD-10-CM | POA: Diagnosis not present

## 2019-05-10 DIAGNOSIS — E7849 Other hyperlipidemia: Secondary | ICD-10-CM | POA: Diagnosis not present

## 2019-05-28 ENCOUNTER — Ambulatory Visit: Payer: Medicare Other | Admitting: Orthopaedic Surgery

## 2019-06-06 ENCOUNTER — Ambulatory Visit
Admission: RE | Admit: 2019-06-06 | Discharge: 2019-06-06 | Disposition: A | Discharge status: Home / Self Care | Payer: Medicare Other | Source: Ambulatory Visit | Attending: Orthopaedic Surgery | Admitting: Orthopaedic Surgery

## 2019-06-06 ENCOUNTER — Other Ambulatory Visit: Payer: Self-pay

## 2019-06-06 DIAGNOSIS — M25412 Effusion, left shoulder: Secondary | ICD-10-CM | POA: Diagnosis not present

## 2019-06-06 DIAGNOSIS — G8929 Other chronic pain: Secondary | ICD-10-CM

## 2019-06-12 ENCOUNTER — Other Ambulatory Visit: Payer: Medicare Other

## 2019-06-18 ENCOUNTER — Other Ambulatory Visit: Payer: Self-pay

## 2019-06-18 ENCOUNTER — Encounter: Payer: Self-pay | Admitting: Orthopaedic Surgery

## 2019-06-18 ENCOUNTER — Ambulatory Visit: Payer: Medicare Other | Admitting: Orthopaedic Surgery

## 2019-06-18 VITALS — BP 125/77 | HR 77 | Ht 66.0 in | Wt 285.0 lb

## 2019-06-18 DIAGNOSIS — G8929 Other chronic pain: Secondary | ICD-10-CM | POA: Diagnosis not present

## 2019-06-18 DIAGNOSIS — M25512 Pain in left shoulder: Secondary | ICD-10-CM | POA: Diagnosis not present

## 2019-06-18 DIAGNOSIS — Z6841 Body Mass Index (BMI) 40.0 and over, adult: Secondary | ICD-10-CM

## 2019-06-18 NOTE — Addendum Note (Signed)
Addended by: Baird Kay on: 06/18/2019 03:30 PM   Modules accepted: Orders

## 2019-06-18 NOTE — Progress Notes (Signed)
Patient Lorraine Horton, female DOB:[birth year], 59 y.o. WCB:[id number]  Chief Complaint  Patient presents with  . Shoulder Pain    left    HPI  Lorraine Horton is a 59 y.o. female who has left shoulder pain.  She had MRI which showed:  IMPRESSION: 1. Severe osteoarthritis of the left glenohumeral joint. 2. Mild tendinosis of the supraspinatus tendon. 3. Mild tendinosis of the infraspinatus tendon. 4. Moderate tendinosis of the subscapularis tendon. 5. Moderate joint effusion with synovitis.  I have explained the findings to her.  I will have Dr. August Saucer in [place] see her and evaluate her situation.  Body mass index is 46 kg/m.  The patient meets the AMA guidelines for Morbid (severe) obesity with a BMI > 40.0 and I have recommended weight loss.   ROS  Review of Systems  Constitutional: Positive for activity change.  Musculoskeletal: Positive for arthralgias, back pain and joint swelling.  All other systems reviewed and are negative.   All other systems reviewed and are negative.  The following is a summary of the past history medically, past history surgically, known current medicines, social history and family history.  This information is gathered electronically by the computer from prior information and documentation.  I review this each visit and have found including this information at this point in the chart is beneficial and informative.    Past Medical History:  Diagnosis Date  . AC (acromioclavicular) joint bone spurs   . Bursitis     Past Surgical History:  Procedure Laterality Date  . CHOLECYSTECTOMY    . KNEE SURGERY    . TUBAL LIGATION      Family History  Problem Relation Age of Onset  . Stroke Mother   . High blood pressure Mother   . High blood pressure Father   . Diabetes Father     Social History Social History   Tobacco Use  . Smoking status: Never Smoker  . Smokeless tobacco: Never Used  Substance Use Topics  . Alcohol use:  Yes    Comment: occ  . Drug use: No    Allergies  Allergen Reactions  . Bee Venom Shortness Of Breath and Swelling  . Codeine Nausea And Vomiting and Other (See Comments)    REACTION: Passes out in addition to nausea and vomiting  . Darvocet [Propoxyphene N-Acetaminophen] Nausea And Vomiting and Other (See Comments)    REACTION: passes out in addition to nausea and vomiting    Current Outpatient Medications  Medication Sig Dispense Refill  . aspirin 81 MG chewable tablet Chew 1 tablet by mouth daily.    Marland Kitchen atorvastatin (LIPITOR) 20 MG tablet Take 20 mg by mouth daily.    Marland Kitchen buPROPion (WELLBUTRIN XL) 150 MG 24 hr tablet Take 450 mg by mouth daily.    . Canagliflozin-metFORMIN HCl 50-500 MG TABS Take 1 tablet by mouth 2 (two) times daily.    . DULoxetine (CYMBALTA) 30 MG capsule Take 1 capsule by mouth daily.    Marland Kitchen FLUoxetine (PROZAC) 40 MG capsule Take 80 mg by mouth daily.    Marland Kitchen gabapentin (NEURONTIN) 800 MG tablet Take 1 tablet by mouth 4 (four) times daily.    Marland Kitchen glipiZIDE (GLUCOTROL) 5 MG tablet Take 1 tablet by mouth 2 (two) times daily as needed.    Marland Kitchen lisinopril (PRINIVIL,ZESTRIL) 20 MG tablet Take 20 mg by mouth daily.    . naproxen (NAPROSYN) 500 MG tablet Take 1 tablet (500 mg total) by mouth 2 (two) times  daily with a meal. 60 tablet 5  . tiZANidine (ZANAFLEX) 4 MG tablet Take 4 mg by mouth 3 (three) times daily as needed.    . traMADol (ULTRAM) 50 MG tablet Take 1 tablet (50 mg total) by mouth every 6 (six) hours as needed. 20 tablet 0   No current facility-administered medications for this visit.     Physical Exam  Blood pressure 125/77, pulse 77, height 5\' 6"  (1.676 m), weight 285 lb (129.3 kg).  Constitutional: overall normal hygiene, normal nutrition, well developed, normal grooming, normal body habitus. Assistive device:none  Musculoskeletal: gait and station Limp none, muscle tone and strength are normal, no tremors or atrophy is present.  .  Neurological:  coordination overall normal.  Deep tendon reflex/nerve stretch intact.  Sensation normal.  Cranial nerves II-XII intact.   Skin:   Normal overall no scars, lesions, ulcers or rashes. No psoriasis.  Psychiatric: Alert and oriented x 3.  Recent memory intact, remote memory unclear.  Normal mood and affect. Well groomed.  Good eye contact.  Cardiovascular: overall no swelling, no varicosities, no edema bilaterally, normal temperatures of the legs and arms, no clubbing, cyanosis and good capillary refill.  Lymphatic: palpation is normal.  Left shoulder painful ROM.  NV intact.    All other systems reviewed and are negative   The patient has been educated about the nature of the problem(s) and counseled on treatment options.  The patient appeared to understand what I have discussed and is in agreement with it.  Encounter Diagnoses  Name Primary?  . Chronic left shoulder pain Yes  . Body mass index 40.0-44.9, adult ([place])   . Morbid obesity ([place])     PLAN Call if any problems.  Precautions discussed.  Continue current medications.   Return to clinic to see Dr. Marlou Sa   Electronically Signed Sanjuana Kava, MD 4/6/202110:50 AM

## 2019-06-21 IMAGING — DX LEFT WRIST - COMPLETE 3+ VIEW
4 series · 4 of 4 positions shown · non-contrast
Comparison: Radiographs 08/21/2014.

CLINICAL DATA: Radial and dorsal wrist pain since falling
yesterday.

EXAM:
LEFT WRIST - COMPLETE 3+ VIEW

[wrist pa]
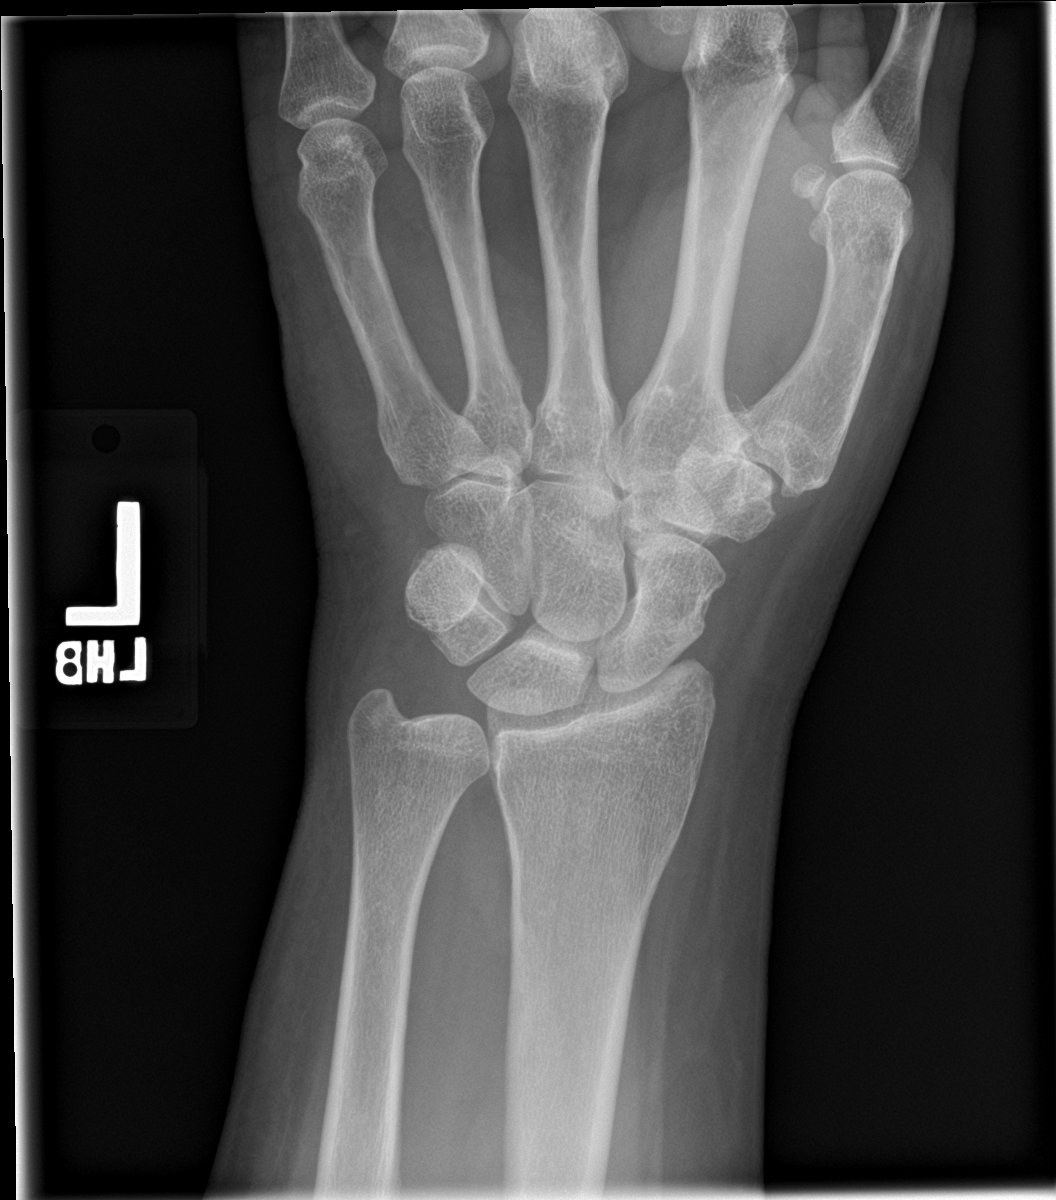

[wrist obl]
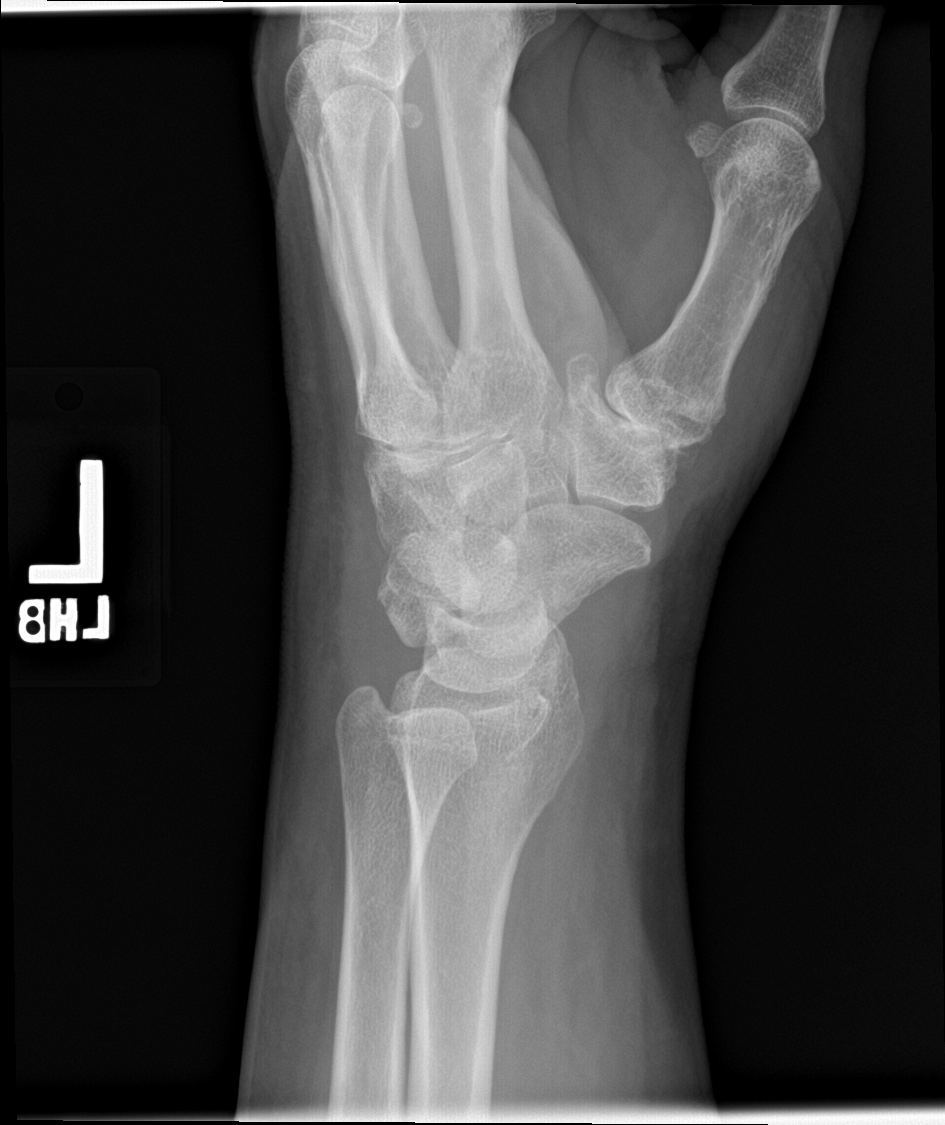

[wrist lat]
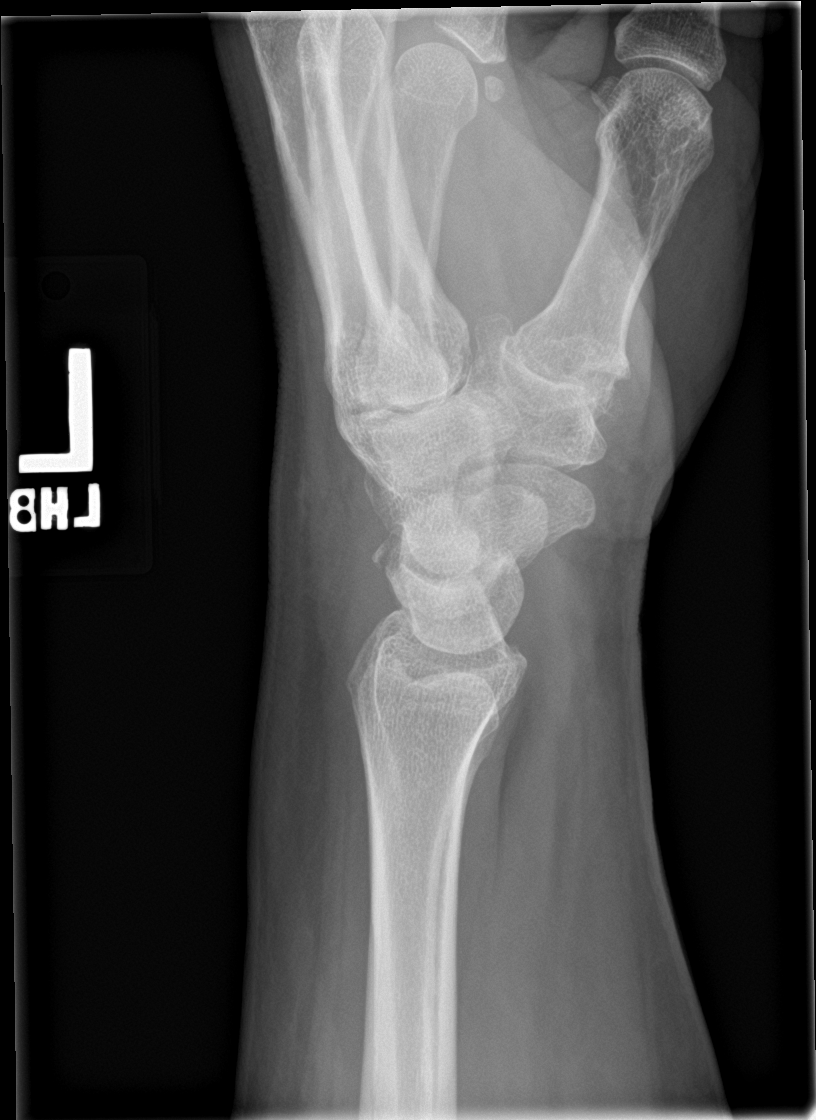

[wrist navicular]
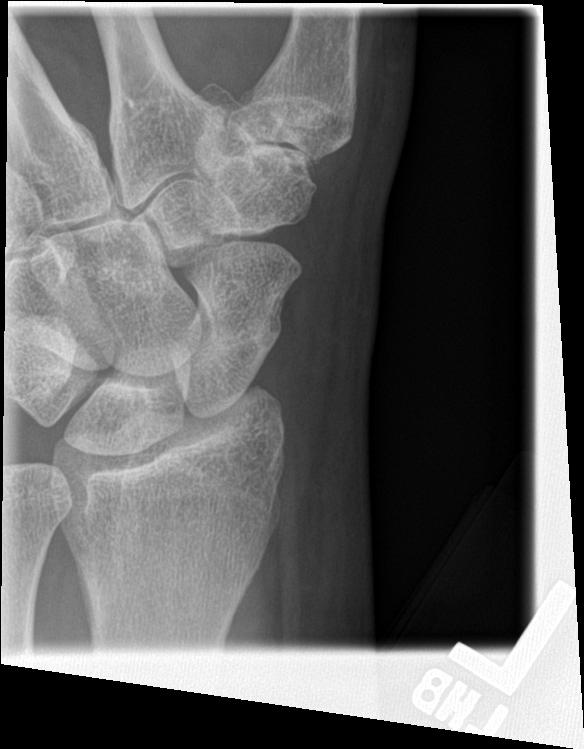

[4 of 4 positions shown; findings below may reference images not displayed]

FINDINGS: There is a small acute dorsal avulsion fracture on the lateral view,
likely arising from the triquetrum. No other evidence of acute
fracture or dislocation. There are mild degenerative changes at the
1st carpometacarpal articulation. Dorsal soft tissue swelling and a
wrist joint effusion are noted.
IMPRESSION: Small acute dorsal avulsion fracture, likely arising from the
triquetrum.

## 2019-06-21 IMAGING — DX LEFT KNEE - COMPLETE 4+ VIEW
4 series · 4 of 4 positions shown · non-contrast
Comparison: Radiographs 04/04/2013.

CLINICAL DATA: Lateral suprapatellar knee pain since falling
yesterday.

EXAM:
LEFT KNEE - COMPLETE 4+ VIEW

[knee ap (1 of 3)]
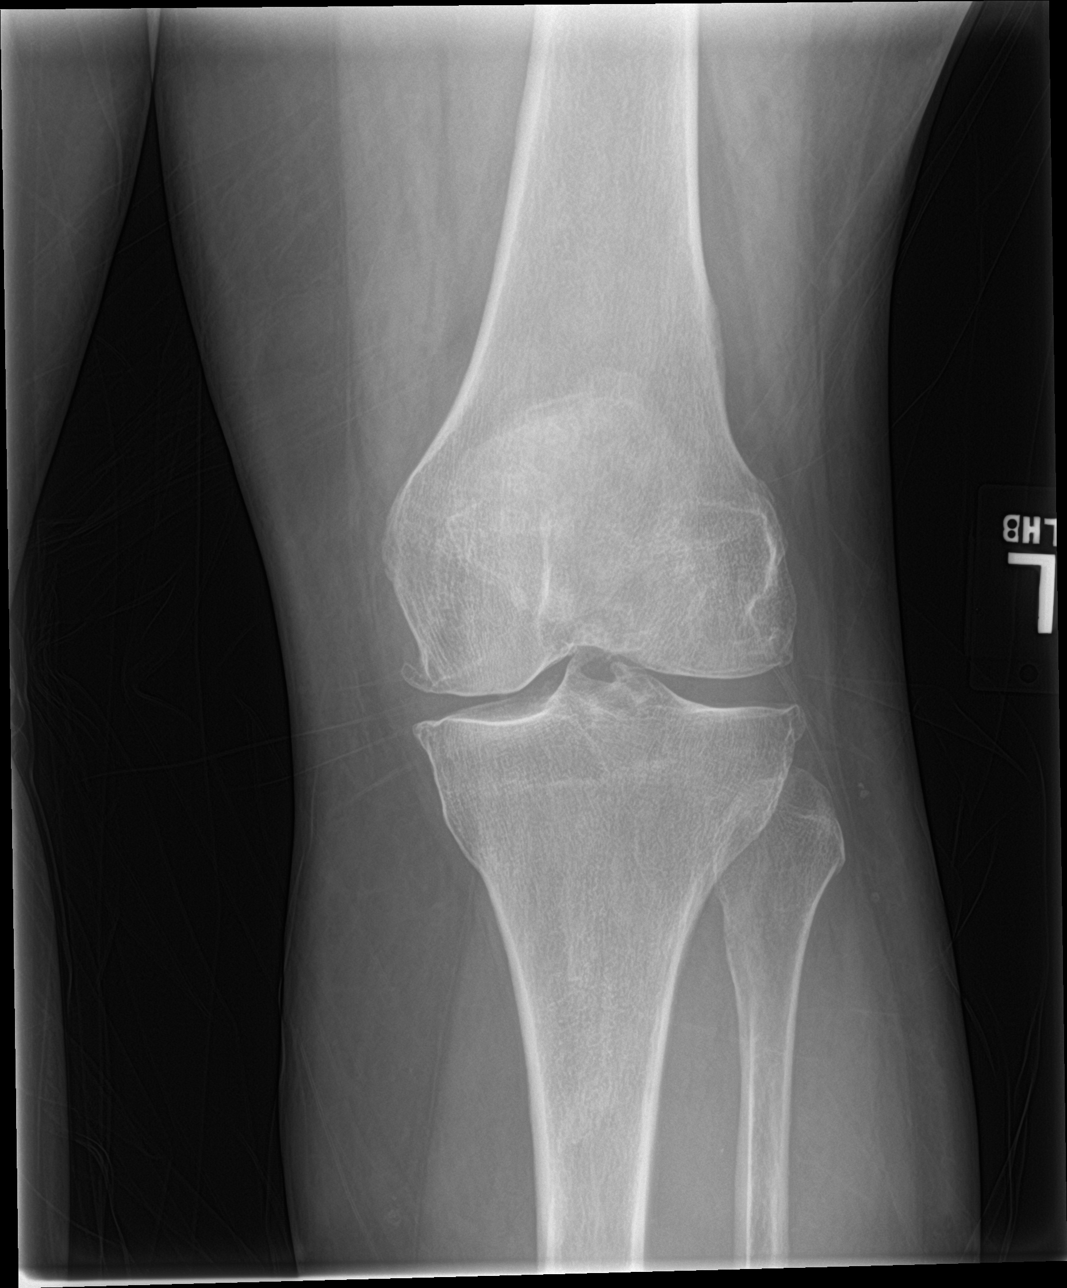

[knee lat]
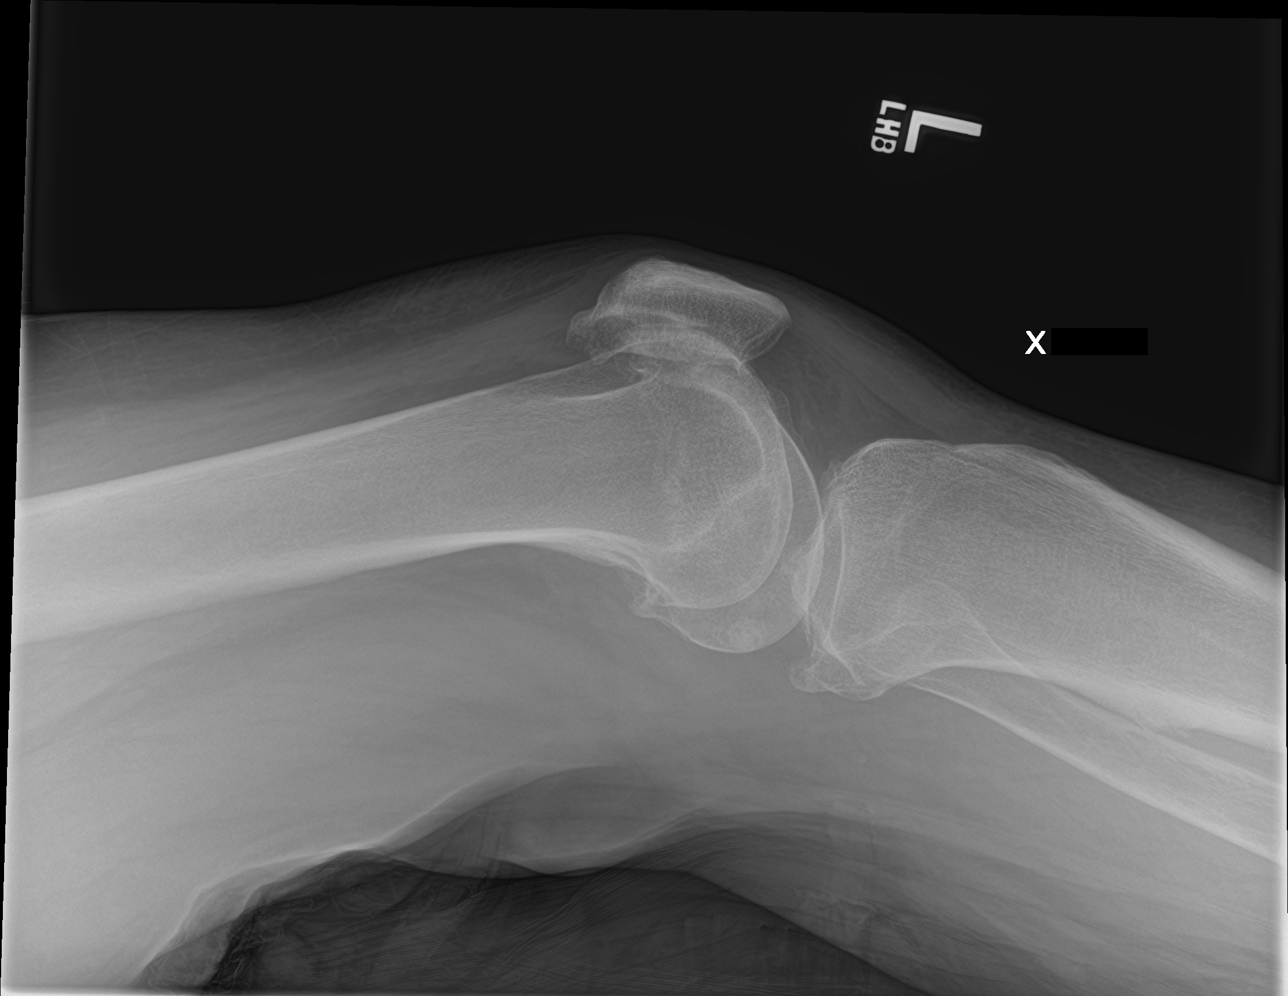

[knee ap (2 of 3)]
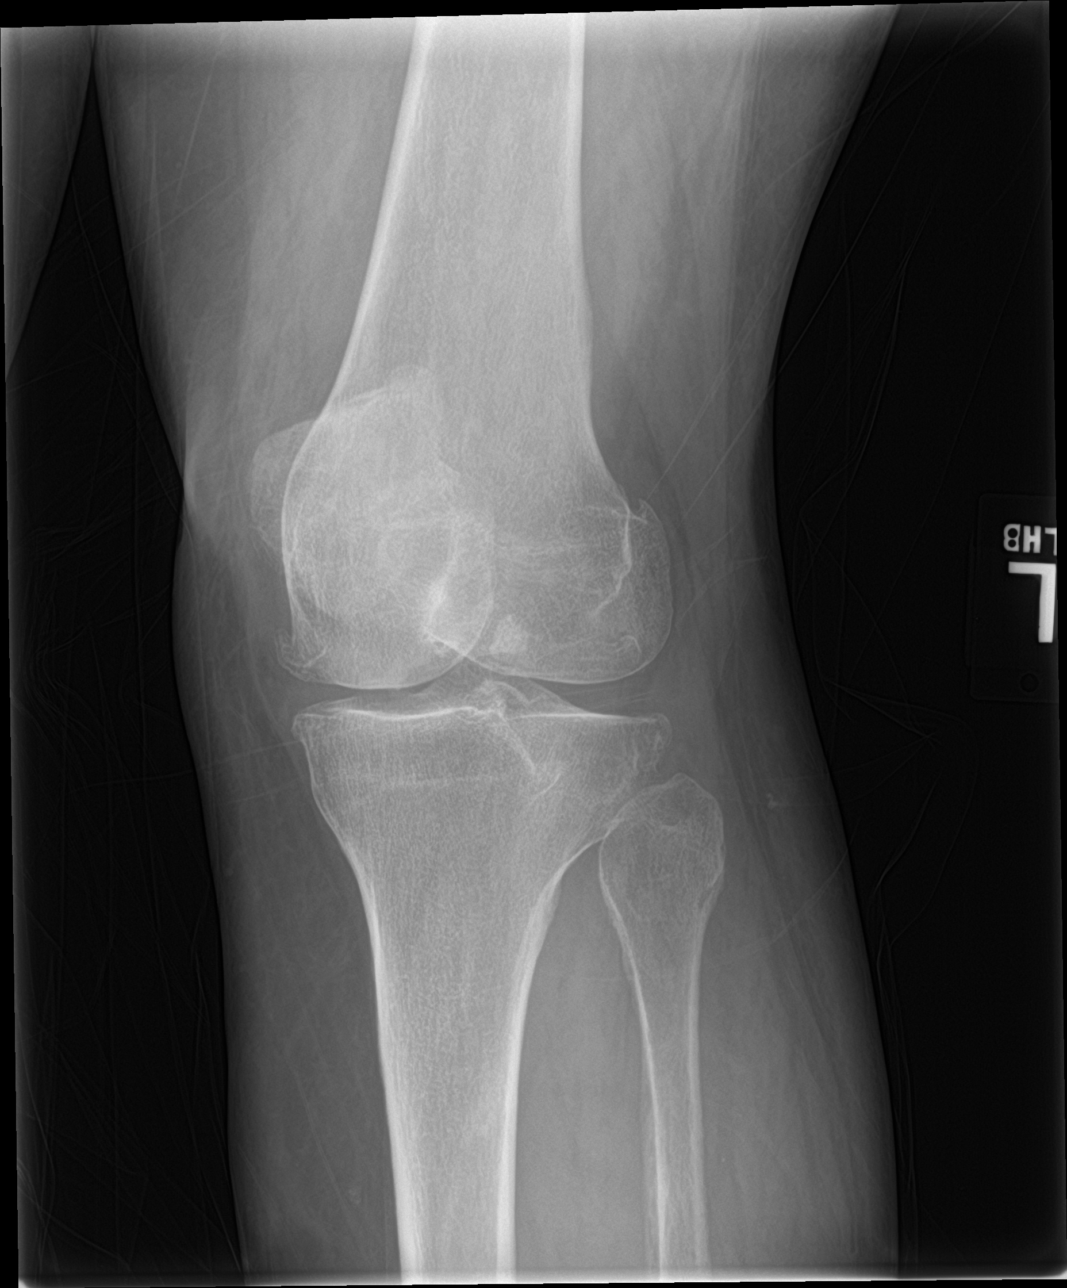

[knee ap (3 of 3)]
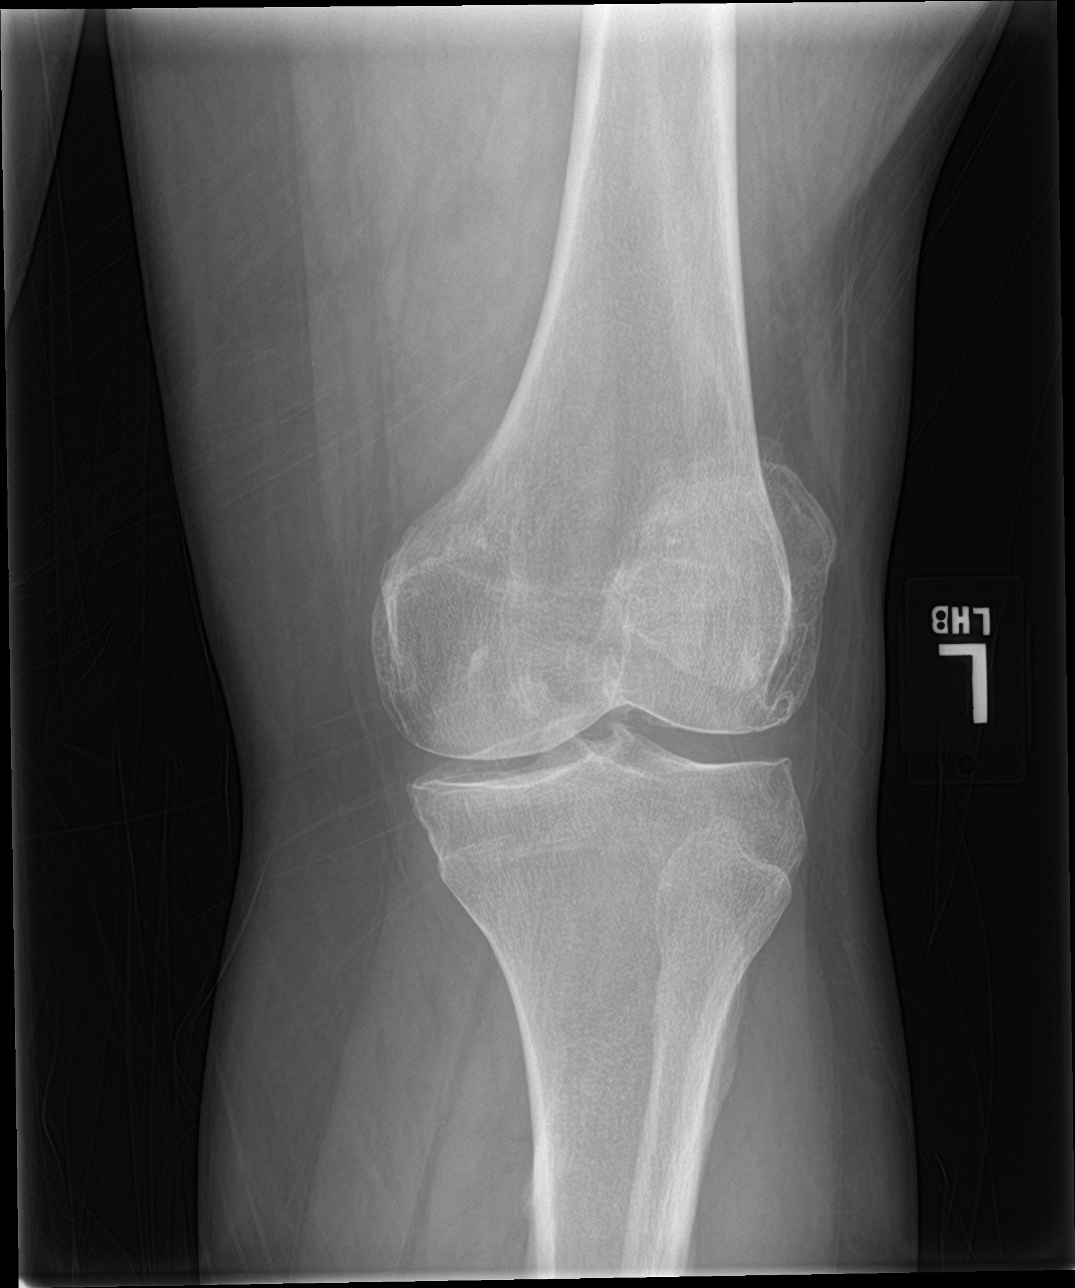

[4 of 4 positions shown; findings below may reference images not displayed]

FINDINGS: The mineralization and alignment are normal. There is no evidence of
acute fracture or dislocation. There are mildly progressive
tricompartmental degenerative changes. No significant knee joint
effusion.
IMPRESSION: Progressive tricompartmental degenerative changes since 7675. No
acute osseous findings or significant joint effusion.

## 2019-07-11 ENCOUNTER — Ambulatory Visit: Payer: Self-pay

## 2019-07-11 ENCOUNTER — Encounter: Payer: Self-pay | Admitting: Orthopedic Surgery

## 2019-07-11 ENCOUNTER — Ambulatory Visit (INDEPENDENT_AMBULATORY_CARE_PROVIDER_SITE_OTHER): Payer: Medicare Other | Admitting: Orthopedic Surgery

## 2019-07-11 ENCOUNTER — Other Ambulatory Visit: Payer: Self-pay

## 2019-07-11 DIAGNOSIS — M25512 Pain in left shoulder: Secondary | ICD-10-CM | POA: Diagnosis not present

## 2019-07-11 NOTE — Progress Notes (Signed)
Office Visit Note   Patient: Lorraine Horton           Date of Birth: Dec 30, 1960           MRN: [id number] Visit Date: 07/11/2019 Requested by: Sanjuana Kava, [address] [place],  [place] [address] PCP: Scherrie Bateman  Subjective: Chief Complaint  Patient presents with  . Left Shoulder - Pain    HPI: Lorraine Horton is a patient with bilateral shoulder pain left worse than right.  Denies any injury.  Reports pain for over a year but has been particularly severe over the past tooth months.  Reports constant pain as well as a burning sensation.  The pain does wake her from sleep at night.  She is right-hand dominant.  Reports some catching with overhead motion and she describes diminished overhead motion on that left-hand side.  Symptoms similar on the right but not as severe.  She is on disability for chronic back and knee issues.  She has not had knee surgery.  She is currently taking gabapentin tramadol naproxen and tizanidine.  She is in pain management.  Has diabetes but her hemoglobin A1c is 5.2.  She has had a subacromial injection which did not help.              ROS: All systems reviewed are negative as they relate to the chief complaint within the history of present illness.  Patient denies  fevers or chills.   Assessment & Plan: Visit Diagnoses:  1. Left shoulder pain, unspecified chronicity     Plan: Impression is bilateral shoulder pain left worse than right.  Mild to moderate arthritis is present without glenoid deformity.  Tendinosis is present in the rotator cuff tendons.  I think she may be heading for total shoulder replacement; however, I would favor observation for now.  Discussed with her the risk and benefits of ultrasound-guided intra-articular cortisone injection versus shoulder replacement.  I think it would be best to try one intra-articular injection before proceeding with shoulder replacement.  That was performed today by Dr. Junius Roads.  Plan to see her  back in 8 weeks for clinical recheck and decision for or against surgical intervention.  Because of her glenoid deformity which is minimal to 9 she may be a candidate for stentless prosthesis.  Follow-Up Instructions: No follow-ups on file.   Orders:  Orders Placed This Encounter  Procedures  . US Guided Needle Placement   No orders of the defined types were placed in this encounter.     Procedures: No procedures performed   Clinical Data: No additional findings.  Objective: Vital Signs: LMP  (LMP Unknown) Comment: PERIMENOPAUSAL/TUBAL LIGATION IN 1992  Physical Exam:   Constitutional: Patient appears well-developed HEENT:  Head: Normocephalic Eyes:EOM are normal Neck: Normal range of motion Cardiovascular: Normal rate Pulmonary/chest: Effort normal Neurologic: Patient is alert Skin: Skin is warm Psychiatric: Patient has normal mood and affect    Ortho Exam: Ortho exam demonstrates full active and passive range of motion of the elbow and neck on the left-hand side.  No definite paresthesias C5-T1.  She has fair amount of pain even with passive range of motion of that left shoulder.  Difficult for her to get actively above 90 degrees of forward flexion abduction but I can get her there passively.  No restriction of external rotation of 15 degrees of abduction.  Rotator cuff strength is good infraspinatus supraspinatus and subscap muscle testing.  Most of her limitation of  motion I think is due to guarding from pain as opposed to actual physical restriction.  Specialty Comments:  No specialty comments available.  Imaging: US Guided Needle Placement  Result Date: 07/11/2019 Ultrasound-guided left glenohumeral injection: After sterile prep with Betadine, injected 8 cc 1% lidocaine without epinephrine and 40 mg methylprednisolone using a 22-gauge spinal needle, passing the needle from posterior approach into the glenohumeral joint.  Injectate seen filling joint capsule.       PMFS History: Patient Active Problem List   Diagnosis Date Noted  . Bilateral low back pain with sciatica 08/24/2015   Past Medical History:  Diagnosis Date  . AC (acromioclavicular) joint bone spurs   . Bursitis     Family History  Problem Relation Age of Onset  . Stroke Mother   . High blood pressure Mother   . High blood pressure Father   . Diabetes Father     Past Surgical History:  Procedure Laterality Date  . CHOLECYSTECTOMY    . KNEE SURGERY    . TUBAL LIGATION     Social History   Occupational History  . Not on file  Tobacco Use  . Smoking status: Never Smoker  . Smokeless tobacco: Never Used  Substance and Sexual Activity  . Alcohol use: Yes    Comment: occ  . Drug use: No  . Sexual activity: Yes    Birth control/protection: Surgical

## 2019-07-11 NOTE — Progress Notes (Signed)
Subjective: Patient is here for ultrasound-guided intra-articular left glenohumeral injection.    Objective:  Very limited ROM.  Procedure: Ultrasound-guided left glenohumeral injection: After sterile prep with Betadine, injected 8 cc 1% lidocaine without epinephrine and 40 mg methylprednisolone using a 22-gauge spinal needle, passing the needle from posterior approach into the glenohumeral joint.  Injectate seen filling joint capsule.

## 2019-07-31 DIAGNOSIS — R519 Headache, unspecified: Secondary | ICD-10-CM | POA: Diagnosis not present

## 2019-07-31 DIAGNOSIS — M542 Cervicalgia: Secondary | ICD-10-CM | POA: Diagnosis not present

## 2019-07-31 DIAGNOSIS — M545 Low back pain: Secondary | ICD-10-CM | POA: Diagnosis not present

## 2019-10-03 ENCOUNTER — Ambulatory Visit: Payer: Medicare Other

## 2019-10-03 ENCOUNTER — Ambulatory Visit: Payer: Medicare Other | Admitting: Orthopaedic Surgery

## 2019-10-03 ENCOUNTER — Other Ambulatory Visit: Payer: Self-pay

## 2019-10-03 ENCOUNTER — Encounter: Payer: Self-pay | Admitting: Orthopaedic Surgery

## 2019-10-03 VITALS — BP 93/58 | HR 72 | Ht 66.0 in | Wt 278.0 lb

## 2019-10-03 DIAGNOSIS — M25561 Pain in right knee: Secondary | ICD-10-CM

## 2019-10-03 DIAGNOSIS — Z6841 Body Mass Index (BMI) 40.0 and over, adult: Secondary | ICD-10-CM | POA: Diagnosis not present

## 2019-10-03 DIAGNOSIS — Y92512 Supermarket, store or market as the place of occurrence of the external cause: Secondary | ICD-10-CM

## 2019-10-03 DIAGNOSIS — W19XXXA Unspecified fall, initial encounter: Secondary | ICD-10-CM

## 2019-10-03 NOTE — Progress Notes (Signed)
Patient ZO:XWRUE Lorraine Horton, female DOB:02-19-1961, 59 y.o. AVW:[id number]  Chief Complaint  Patient presents with  . Knee Pain    right / fall last week in [employer]     HPI  Lorraine Horton is a 59 y.o. female who fell at the [facility] last week and hurt her right knee.  She has pain more laterally and some around the patella area. She has swelling and popping. She has no giving way. It has not improved.  She has used ice and Advil.   Body mass index is 44.87 kg/m.  The patient meets the AMA guidelines for Morbid (severe) obesity with a BMI > 40.0 and I have recommended weight loss.   ROS  Review of Systems  Constitutional: Positive for activity change.  Musculoskeletal: Positive for arthralgias, back pain and joint swelling.  All other systems reviewed and are negative.   All other systems reviewed and are negative.  The following is a summary of the past history medically, past history surgically, known current medicines, social history and family history.  This information is gathered electronically by the computer from prior information and documentation.  I review this each visit and have found including this information at this point in the chart is beneficial and informative.    Past Medical History:  Diagnosis Date  . AC (acromioclavicular) joint bone spurs   . Bursitis     Past Surgical History:  Procedure Laterality Date  . CHOLECYSTECTOMY    . KNEE SURGERY    . TUBAL LIGATION      Family History  Problem Relation Age of Onset  . Stroke Mother   . High blood pressure Mother   . High blood pressure Father   . Diabetes Father     Social History Social History   Tobacco Use  . Smoking status: Never Smoker  . Smokeless tobacco: Never Used  Substance Use Topics  . Alcohol use: Yes    Comment: occ  . Drug use: No    Allergies  Allergen Reactions  . Bee Venom Shortness Of Breath and Swelling  . Codeine Nausea And Vomiting and Other (See Comments)     REACTION: Passes out in addition to nausea and vomiting  . Darvocet [Propoxyphene N-Acetaminophen] Nausea And Vomiting and Other (See Comments)    REACTION: passes out in addition to nausea and vomiting    Current Outpatient Medications  Medication Sig Dispense Refill  . aspirin 81 MG chewable tablet Chew 1 tablet by mouth daily.    Marland Kitchen atorvastatin (LIPITOR) 20 MG tablet Take 20 mg by mouth daily.    Marland Kitchen buPROPion (WELLBUTRIN XL) 150 MG 24 hr tablet Take 450 mg by mouth daily.    . Canagliflozin-metFORMIN HCl 50-500 MG TABS Take 1 tablet by mouth 2 (two) times daily.    . DULoxetine (CYMBALTA) 30 MG capsule Take 1 capsule by mouth daily.    Marland Kitchen FLUoxetine (PROZAC) 40 MG capsule Take 80 mg by mouth daily.    Marland Kitchen gabapentin (NEURONTIN) 800 MG tablet Take 1 tablet by mouth 4 (four) times daily.    Marland Kitchen glipiZIDE (GLUCOTROL) 5 MG tablet Take 1 tablet by mouth 2 (two) times daily as needed.    Marland Kitchen lisinopril (PRINIVIL,ZESTRIL) 20 MG tablet Take 20 mg by mouth daily.    . naproxen (NAPROSYN) 500 MG tablet Take 1 tablet (500 mg total) by mouth 2 (two) times daily with a meal. 60 tablet 5  . tiZANidine (ZANAFLEX) 4 MG tablet Take 4 mg  by mouth 3 (three) times daily as needed.    . traMADol (ULTRAM) 50 MG tablet Take 1 tablet (50 mg total) by mouth every 6 (six) hours as needed. 20 tablet 0  . metFORMIN (GLUCOPHAGE) 500 MG tablet SMARTSIG:1 Tablet(s) By Mouth Morning-Evening    . topiramate (TOPAMAX) 25 MG tablet Take 25 mg by mouth at bedtime.     No current facility-administered medications for this visit.     Physical Exam  Blood pressure (!) 93/58, pulse 72, height 5\' 6"  (1.676 m), weight 278 lb (126.1 kg).  Constitutional: overall normal hygiene, normal nutrition, well developed, normal grooming, normal body habitus. Assistive device:none  Musculoskeletal: gait and station Limp Right, muscle tone and strength are normal, no tremors or atrophy is present.  .  Neurological: coordination overall  normal.  Deep tendon reflex/nerve stretch intact.  Sensation normal.  Cranial nerves II-XII intact.   Skin:   Normal overall no scars, lesions, ulcers or rashes. No psoriasis.  Psychiatric: Alert and oriented x 3.  Recent memory intact, remote memory unclear.  Normal mood and affect. Well groomed.  Good eye contact.  Cardiovascular: overall no swelling, no varicosities, no edema bilaterally, normal temperatures of the legs and arms, no clubbing, cyanosis and good capillary refill.  Lymphatic: palpation is normal.  Right knee has tenderness more laterally, slight effusion and crepitus, ROM 0 to 105, stable, NV intact.  Limp right. All other systems reviewed and are negative   The patient has been educated about the nature of the problem(s) and counseled on treatment options.  The patient appeared to understand what I have discussed and is in agreement with it.  Encounter Diagnosis  Name Primary?  . Acute pain of right knee Yes   X-rays were done of the right knee, reported separately.  PROCEDURE NOTE:  The patient requests injections of the right knee , verbal consent was obtained.  The right knee was prepped appropriately after time out was performed.   Sterile technique was observed and injection of 1 cc of Depo-Medrol 40 mg with several cc's of plain xylocaine. Anesthesia was provided by ethyl chloride and a 20-gauge needle was used to inject the knee area. The injection was tolerated well.  A band aid dressing was applied.  The patient was advised to apply ice later today and tomorrow to the injection sight as needed.  PLAN Call if any problems.  Precautions discussed.  Continue current medications.   Return to clinic 2 weeks   She may need MRI of the knee if not improved.  Electronically Signed , MD 7/22/202111:43 AM

## 2019-10-07 ENCOUNTER — Emergency Department (HOSPITAL_COMMUNITY): Payer: Medicare Other

## 2019-10-07 ENCOUNTER — Encounter (HOSPITAL_COMMUNITY): Payer: Self-pay

## 2019-10-07 ENCOUNTER — Emergency Department (HOSPITAL_COMMUNITY)
Admission: EM | Admit: 2019-10-07 | Discharge: 2019-10-07 | Disposition: A | Discharge status: Home / Self Care | Payer: Medicare Other | Attending: Emergency Medicine | Admitting: Emergency Medicine

## 2019-10-07 DIAGNOSIS — M79603 Pain in arm, unspecified: Secondary | ICD-10-CM | POA: Diagnosis not present

## 2019-10-07 DIAGNOSIS — Z7982 Long term (current) use of aspirin: Secondary | ICD-10-CM | POA: Insufficient documentation

## 2019-10-07 DIAGNOSIS — R519 Headache, unspecified: Secondary | ICD-10-CM | POA: Insufficient documentation

## 2019-10-07 DIAGNOSIS — M542 Cervicalgia: Secondary | ICD-10-CM | POA: Diagnosis not present

## 2019-10-07 DIAGNOSIS — S6991XA Unspecified injury of right wrist, hand and finger(s), initial encounter: Secondary | ICD-10-CM | POA: Diagnosis not present

## 2019-10-07 DIAGNOSIS — S299XXA Unspecified injury of thorax, initial encounter: Secondary | ICD-10-CM | POA: Diagnosis not present

## 2019-10-07 DIAGNOSIS — M25511 Pain in right shoulder: Secondary | ICD-10-CM | POA: Insufficient documentation

## 2019-10-07 DIAGNOSIS — S3991XA Unspecified injury of abdomen, initial encounter: Secondary | ICD-10-CM | POA: Diagnosis not present

## 2019-10-07 DIAGNOSIS — M25531 Pain in right wrist: Secondary | ICD-10-CM | POA: Diagnosis not present

## 2019-10-07 DIAGNOSIS — M79601 Pain in right arm: Secondary | ICD-10-CM | POA: Diagnosis not present

## 2019-10-07 DIAGNOSIS — T07XXXA Unspecified multiple injuries, initial encounter: Secondary | ICD-10-CM | POA: Diagnosis not present

## 2019-10-07 DIAGNOSIS — S3993XA Unspecified injury of pelvis, initial encounter: Secondary | ICD-10-CM | POA: Diagnosis not present

## 2019-10-07 DIAGNOSIS — Y999 Unspecified external cause status: Secondary | ICD-10-CM | POA: Insufficient documentation

## 2019-10-07 DIAGNOSIS — S99911A Unspecified injury of right ankle, initial encounter: Secondary | ICD-10-CM | POA: Diagnosis not present

## 2019-10-07 DIAGNOSIS — S4991XA Unspecified injury of right shoulder and upper arm, initial encounter: Secondary | ICD-10-CM | POA: Diagnosis not present

## 2019-10-07 DIAGNOSIS — S0990XA Unspecified injury of head, initial encounter: Secondary | ICD-10-CM | POA: Diagnosis not present

## 2019-10-07 DIAGNOSIS — T1490XA Injury, unspecified, initial encounter: Secondary | ICD-10-CM

## 2019-10-07 DIAGNOSIS — S199XXA Unspecified injury of neck, initial encounter: Secondary | ICD-10-CM | POA: Diagnosis not present

## 2019-10-07 DIAGNOSIS — Y929 Unspecified place or not applicable: Secondary | ICD-10-CM | POA: Insufficient documentation

## 2019-10-07 DIAGNOSIS — M25571 Pain in right ankle and joints of right foot: Secondary | ICD-10-CM | POA: Insufficient documentation

## 2019-10-07 DIAGNOSIS — Y939 Activity, unspecified: Secondary | ICD-10-CM | POA: Diagnosis not present

## 2019-10-07 DIAGNOSIS — S59911A Unspecified injury of right forearm, initial encounter: Secondary | ICD-10-CM | POA: Diagnosis not present

## 2019-10-07 DIAGNOSIS — S3992XA Unspecified injury of lower back, initial encounter: Secondary | ICD-10-CM | POA: Diagnosis not present

## 2019-10-07 LAB — CBC WITH DIFFERENTIAL/PLATELET
Abs Immature Granulocytes: 0.04 10*3/uL (ref 0.00–0.07)
Basophils Absolute: 0.1 10*3/uL (ref 0.0–0.1)
Basophils Relative: 1 %
Eosinophils Absolute: 0 10*3/uL (ref 0.0–0.5)
Eosinophils Relative: 0 %
HCT: 33.9 % — ABNORMAL LOW (ref 36.0–46.0)
Hemoglobin: 10.6 g/dL — ABNORMAL LOW (ref 12.0–15.0)
Immature Granulocytes: 0 %
Lymphocytes Relative: 9 %
Lymphs Abs: 0.9 10*3/uL (ref 0.7–4.0)
MCH: 29.6 pg (ref 26.0–34.0)
MCHC: 31.3 g/dL (ref 30.0–36.0)
MCV: 94.7 fL (ref 80.0–100.0)
Monocytes Absolute: 0.7 10*3/uL (ref 0.1–1.0)
Monocytes Relative: 7 %
Neutro Abs: 8.9 10*3/uL — ABNORMAL HIGH (ref 1.7–7.7)
Neutrophils Relative %: 83 %
Platelets: 277 10*3/uL (ref 150–400)
RBC: 3.58 MIL/uL — ABNORMAL LOW (ref 3.87–5.11)
RDW: 12.9 % (ref 11.5–15.5)
WBC: 10.7 10*3/uL — ABNORMAL HIGH (ref 4.0–10.5)
nRBC: 0 % (ref 0.0–0.2)

## 2019-10-07 LAB — COMPREHENSIVE METABOLIC PANEL
ALT: 16 U/L (ref 0–44)
AST: 17 U/L (ref 15–41)
Albumin: 3.6 g/dL (ref 3.5–5.0)
Alkaline Phosphatase: 70 U/L (ref 38–126)
Anion gap: 8 (ref 5–15)
BUN: 24 mg/dL — ABNORMAL HIGH (ref 6–20)
CO2: 22 mmol/L (ref 22–32)
Calcium: 8.9 mg/dL (ref 8.9–10.3)
Chloride: 108 mmol/L (ref 98–111)
Creatinine, Ser: 0.9 mg/dL (ref 0.44–1.00)
GFR calc Af Amer: >60 mL/min (ref >60)
GFR calc non Af Amer: >60 mL/min (ref >60)
Glucose, Bld: 129 mg/dL — ABNORMAL HIGH (ref 70–99)
Potassium: 4.5 mmol/L (ref 3.5–5.1)
Sodium: 138 mmol/L (ref 135–145)
Total Bilirubin: 0.6 mg/dL (ref 0.3–1.2)
Total Protein: 6.8 g/dL (ref 6.5–8.1)

## 2019-10-07 MED ORDER — SODIUM CHLORIDE 0.9 % IV BOLUS
1000.0000 mL | Freq: Once | INTRAVENOUS | Status: AC
  Administered 2019-10-07: 1000 mL via INTRAVENOUS

## 2019-10-07 MED ORDER — MORPHINE SULFATE (PF) 4 MG/ML IV SOLN
4.0000 mg | Freq: Once | INTRAVENOUS | Status: AC
  Administered 2019-10-07: 4 mg via INTRAVENOUS
  Filled 2019-10-07: qty 1

## 2019-10-07 MED ORDER — IOHEXOL 300 MG/ML  SOLN
100.0000 mL | Freq: Once | INTRAMUSCULAR | Status: AC | PRN
  Administered 2019-10-07: 100 mL via INTRAVENOUS
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
## 2019-10-07 NOTE — ED Notes (Signed)
Pt given fluid and [place] sandwhich
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
## 2019-10-07 NOTE — Discharge Instructions (Addendum)
Today you received medications that may make you sleepy or impair your ability to make decisions.  For the next 24 hours please do not drive, operate heavy machinery, care for a small child with out another adult present, or perform any activities that may cause harm to you or someone else if you were to fall asleep or be impaired.   Please take the sling off multiple times a day to move your shoulder around or it can get very stiff which can worsen your condition.    Please use caution when operating heavy machinery or motor vehicles in the future.  Please make sure you are always wearing a helmet.  Do not take your metformin for the next two days as you had contrast with your CT scan today.
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
## 2019-10-14 ENCOUNTER — Telehealth: Payer: Self-pay | Admitting: Orthopaedic Surgery

## 2019-10-17 ENCOUNTER — Ambulatory Visit: Payer: Medicare Other | Admitting: Orthopaedic Surgery

## 2019-10-17 ENCOUNTER — Other Ambulatory Visit: Payer: Self-pay

## 2019-10-17 ENCOUNTER — Encounter: Payer: Self-pay | Admitting: Orthopaedic Surgery

## 2019-10-17 ENCOUNTER — Ambulatory Visit: Payer: Medicare Other

## 2019-10-17 VITALS — BP 119/61 | HR 66 | Ht 66.0 in | Wt 278.0 lb

## 2019-10-17 DIAGNOSIS — M25511 Pain in right shoulder: Secondary | ICD-10-CM

## 2019-10-17 DIAGNOSIS — Z6841 Body Mass Index (BMI) 40.0 and over, adult: Secondary | ICD-10-CM

## 2019-10-17 DIAGNOSIS — G8929 Other chronic pain: Secondary | ICD-10-CM | POA: Diagnosis not present

## 2019-10-17 MED ORDER — PREDNISONE 5 MG (21) PO TBPK
ORAL_TABLET | ORAL | 0 refills | Status: DC
Start: 2019-10-17 — End: 2019-10-29
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
## 2019-10-17 NOTE — Patient Instructions (Addendum)
Do pendulum exercises for your shoulder / let it relax and hang (bend forward slightly) move gently while it is relaxed small movements are fine just let your shoulder hang. Do this several times daily   Wear sling if you are comfortable in it.  Use Aspercreme, Biofreeze or Voltaren gel over the counter 2-3 times daily make sure you rub it in well each time you use it.    Take the prednisone taper it will help with the pain and inflammation

## 2019-10-17 NOTE — Telephone Encounter (Signed)
error 

## 2019-10-22 ENCOUNTER — Ambulatory Visit: Payer: Medicare Other | Admitting: Orthopaedic Surgery

## 2019-10-25 DIAGNOSIS — G8929 Other chronic pain: Secondary | ICD-10-CM | POA: Diagnosis not present

## 2019-10-25 DIAGNOSIS — I10 Essential (primary) hypertension: Secondary | ICD-10-CM | POA: Diagnosis not present

## 2019-10-25 DIAGNOSIS — E7849 Other hyperlipidemia: Secondary | ICD-10-CM | POA: Diagnosis not present

## 2019-10-25 DIAGNOSIS — E118 Type 2 diabetes mellitus with unspecified complications: Secondary | ICD-10-CM | POA: Diagnosis not present

## 2019-10-29 ENCOUNTER — Ambulatory Visit (INDEPENDENT_AMBULATORY_CARE_PROVIDER_SITE_OTHER): Payer: Medicare Other | Admitting: Orthopaedic Surgery

## 2019-10-29 ENCOUNTER — Other Ambulatory Visit: Payer: Self-pay

## 2019-10-29 ENCOUNTER — Encounter: Payer: Self-pay | Admitting: Orthopaedic Surgery

## 2019-10-29 VITALS — BP 110/51 | HR 83 | Ht 66.0 in | Wt 278.0 lb

## 2019-10-29 DIAGNOSIS — Z6841 Body Mass Index (BMI) 40.0 and over, adult: Secondary | ICD-10-CM | POA: Diagnosis not present

## 2019-10-29 DIAGNOSIS — M25561 Pain in right knee: Secondary | ICD-10-CM

## 2019-10-29 DIAGNOSIS — M25511 Pain in right shoulder: Secondary | ICD-10-CM

## 2019-10-29 DIAGNOSIS — G8929 Other chronic pain: Secondary | ICD-10-CM
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
## 2019-10-31 ENCOUNTER — Ambulatory Visit: Payer: Medicare Other | Admitting: Orthopaedic Surgery

## 2019-11-26 ENCOUNTER — Ambulatory Visit: Payer: Medicare Other | Admitting: Orthopaedic Surgery

## 2019-11-26 ENCOUNTER — Encounter: Payer: Self-pay | Admitting: Orthopaedic Surgery

## 2019-11-26 ENCOUNTER — Ambulatory Visit: Payer: Medicare Other

## 2019-11-26 ENCOUNTER — Other Ambulatory Visit: Payer: Self-pay

## 2019-11-26 VITALS — BP 101/63 | HR 77 | Ht 63.0 in | Wt 278.0 lb

## 2019-11-26 DIAGNOSIS — M25561 Pain in right knee: Secondary | ICD-10-CM | POA: Diagnosis not present

## 2019-11-26 DIAGNOSIS — M542 Cervicalgia: Secondary | ICD-10-CM

## 2019-11-26 DIAGNOSIS — G8929 Other chronic pain: Secondary | ICD-10-CM | POA: Diagnosis not present

## 2019-11-26 DIAGNOSIS — Z6841 Body Mass Index (BMI) 40.0 and over, adult: Secondary | ICD-10-CM

## 2019-11-26 NOTE — Progress Notes (Signed)
Patient Lorraine Horton, female DOB:Mar 21, 1960, 59 y.o. ZGY:[id number]  Chief Complaint  Patient presents with  . Shoulder Pain    right   . Knee Pain    right   . Hand Problem    right hand goes numb right arm pain     HPI  Lorraine Horton is a 59 y.o. female who has developed pain in the neck to the elbow on the right to the dorsum of the right hand index and long.  Her arm is weak.  She cannot lift much.  She has no trauma.  Her right knee is much better.   Body mass index is 49.25 kg/m.  The patient meets the AMA guidelines for Morbid (severe) obesity with a BMI > 40.0 and I have recommended weight loss.   ROS  Review of Systems  Constitutional: Positive for activity change.  Musculoskeletal: Positive for arthralgias, back pain and joint swelling.  All other systems reviewed and are negative.   All other systems reviewed and are negative.  The following is a summary of the past history medically, past history surgically, known current medicines, social history and family history.  This information is gathered electronically by the computer from prior information and documentation.  I review this each visit and have found including this information at this point in the chart is beneficial and informative.    Past Medical History:  Diagnosis Date  . AC (acromioclavicular) joint bone spurs   . Bursitis     Past Surgical History:  Procedure Laterality Date  . CHOLECYSTECTOMY    . KNEE SURGERY    . TUBAL LIGATION      Family History  Problem Relation Age of Onset  . Stroke Mother   . High blood pressure Mother   . High blood pressure Father   . Diabetes Father     Social History Social History   Tobacco Use  . Smoking status: Never Smoker  . Smokeless tobacco: Never Used  Substance Use Topics  . Alcohol use: Yes    Comment: occ  . Drug use: No    Allergies  Allergen Reactions  . Bee Venom Shortness Of Breath and Swelling  . Codeine Nausea And  Vomiting and Other (See Comments)    REACTION: Passes out in addition to nausea and vomiting  . Darvocet [Propoxyphene N-Acetaminophen] Nausea And Vomiting and Other (See Comments)    REACTION: passes out in addition to nausea and vomiting    Current Outpatient Medications  Medication Sig Dispense Refill  . aspirin 81 MG chewable tablet Chew 1 tablet by mouth daily.    Marland Kitchen atorvastatin (LIPITOR) 20 MG tablet Take 20 mg by mouth daily.    Marland Kitchen buPROPion (WELLBUTRIN XL) 150 MG 24 hr tablet Take 450 mg by mouth daily.    . DULoxetine (CYMBALTA) 60 MG capsule Take 60 mg by mouth daily.    . Empagliflozin (JARDIANCE PO) Take by mouth.    Marland Kitchen FLUoxetine (PROZAC) 40 MG capsule Take 80 mg by mouth daily.    Marland Kitchen gabapentin (NEURONTIN) 800 MG tablet Take 1 tablet by mouth 4 (four) times daily.    Marland Kitchen lisinopril (PRINIVIL,ZESTRIL) 20 MG tablet Take 20 mg by mouth daily.     . meloxicam (MOBIC) 15 MG tablet Take 15 mg by mouth daily.    . naproxen (NAPROSYN) 500 MG tablet Take 1 tablet (500 mg total) by mouth 2 (two) times daily with a meal. 60 tablet 5  . tiZANidine (ZANAFLEX)  4 MG tablet Take 4 mg by mouth 3 (three) times daily as needed.    . topiramate (TOPAMAX) 25 MG tablet     . traMADol (ULTRAM) 50 MG tablet Take 1 tablet (50 mg total) by mouth every 6 (six) hours as needed. 20 tablet 0   No current facility-administered medications for this visit.     Physical Exam  Blood pressure 101/63, pulse 77, height 5\' 3"  (1.6 m), weight 278 lb (126.1 kg).  Constitutional: overall normal hygiene, normal nutrition, well developed, normal grooming, normal body habitus. Assistive device:none  Musculoskeletal: gait and station Limp none, muscle tone and strength are normal except for right upper extremity, no tremors or atrophy is present.  .  Neurological: coordination overall normal.  Deep tendon reflex/nerve stretch intact.  Sensation normal.  Cranial nerves II-XII intact.   Skin:   Normal overall no  scars, lesions, ulcers or rashes. No psoriasis.  Psychiatric: Alert and oriented x 3.  Recent memory intact, remote memory unclear.  Normal mood and affect. Well groomed.  Good eye contact.  Cardiovascular: overall no swelling, no varicosities, no edema bilaterally, normal temperatures of the legs and arms, no clubbing, cyanosis and good capillary refill.  Lymphatic: palpation is normal.  Neck with painful ROM.  She has weakness of the right upper extremity with 3of 5 for triceps.  Reflexes are present.  Grip decreased. All other systems reviewed and are negative   The patient has been educated about the nature of the problem(s) and counseled on treatment options.  The patient appeared to understand what I have discussed and is in agreement with it.  Encounter Diagnoses  Name Primary?  . Chronic neck pain Yes  . Chronic pain of right knee   . Body mass index 40.0-44.9, adult (HCC)   . Morbid obesity (HCC)     X-rays were done of the cervical spine, reported separately.  I will get MRI of the cervical spine as I am concerned about her weakness and feel she may have HNP. PLAN Call if any problems.  Precautions discussed.  Continue current medications.   Return to clinic 2 weeks   Electronically Signed 12-25-1981, MD 9/14/202110:25 AM

## 2019-12-07 ENCOUNTER — Other Ambulatory Visit: Payer: Self-pay | Admitting: Orthopaedic Surgery

## 2019-12-11 ENCOUNTER — Ambulatory Visit (HOSPITAL_COMMUNITY)
Admission: RE | Admit: 2019-12-11 | Discharge: 2019-12-11 | Disposition: A | Discharge status: Home / Self Care | Payer: Medicare Other | Source: Ambulatory Visit | Attending: Orthopaedic Surgery | Admitting: Orthopaedic Surgery

## 2019-12-11 ENCOUNTER — Other Ambulatory Visit: Payer: Self-pay

## 2019-12-11 DIAGNOSIS — M542 Cervicalgia: Secondary | ICD-10-CM | POA: Insufficient documentation

## 2019-12-11 DIAGNOSIS — G8929 Other chronic pain: Secondary | ICD-10-CM | POA: Diagnosis not present

## 2019-12-12 ENCOUNTER — Ambulatory Visit: Payer: Medicare Other | Admitting: Orthopaedic Surgery

## 2019-12-17 ENCOUNTER — Encounter: Payer: Self-pay | Admitting: Orthopaedic Surgery

## 2019-12-17 ENCOUNTER — Ambulatory Visit: Payer: Medicare Other | Admitting: Orthopaedic Surgery

## 2019-12-17 ENCOUNTER — Other Ambulatory Visit: Payer: Self-pay

## 2019-12-17 VITALS — BP 100/62 | HR 64 | Ht 66.0 in | Wt 266.0 lb

## 2019-12-17 DIAGNOSIS — Z6841 Body Mass Index (BMI) 40.0 and over, adult: Secondary | ICD-10-CM

## 2019-12-17 DIAGNOSIS — G8929 Other chronic pain: Secondary | ICD-10-CM | POA: Diagnosis not present

## 2019-12-17 DIAGNOSIS — M542 Cervicalgia: Secondary | ICD-10-CM | POA: Diagnosis not present

## 2019-12-17 MED ORDER — NAPROXEN 500 MG PO TABS: ORAL_TABLET | ORAL | 5 refills | Status: DC

## 2019-12-17 NOTE — Progress Notes (Signed)
Patient Lorraine Horton [name], female DOB:1960/05/26, 59 y.o. ONG:[id number]  Chief Complaint  Patient presents with   Torticollis    Chronic neck pain    HPI  Lorraine Horton is a 59 y.o. female who has neck pain.  She went to get MRI and it showed: IMPRESSION: 1. Multilevel disc and facet degeneration with reversal of lordosis and multilevel listhesis. 2. C5-6 disc protrusion and endplate spurring causing cord compression. Advanced biforaminal impingement. 3. C4-5 advanced left foraminal impingement. 4. C7-T1 right foraminal impingement.  I have explained the findings to her.  I would like to have neurosurgeon evaluate her and will make appointment.  She is agreeable.  I will refill her Naprosyn.   Body mass index is 42.93 kg/m.  The patient meets the AMA guidelines for Morbid (severe) obesity with a BMI > 40.0 and I have recommended weight loss.   ROS  Review of Systems  Constitutional: Positive for activity change.  Musculoskeletal: Positive for arthralgias, back pain and joint swelling.  All other systems reviewed and are negative.   All other systems reviewed and are negative.  The following is a summary of the past history medically, past history surgically, known current medicines, social history and family history.  This information is gathered electronically by the computer from prior information and documentation.  I review this each visit and have found including this information at this point in the chart is beneficial and informative.    Past Medical History:  Diagnosis Date   AC (acromioclavicular) joint bone spurs    Bursitis     Past Surgical History:  Procedure Laterality Date   CHOLECYSTECTOMY     KNEE SURGERY     TUBAL LIGATION      Family History  Problem Relation Age of Onset   Stroke Mother    High blood pressure Mother    High blood pressure Father    Diabetes Father     Social History Social History   Tobacco Use    Smoking status: Never Smoker   Smokeless tobacco: Never Used  Substance Use Topics   Alcohol use: Yes    Comment: occ   Drug use: No    Allergies  Allergen Reactions   Bee Venom Shortness Of Breath and Swelling   Codeine Nausea And Vomiting and Other (See Comments)    REACTION: Passes out in addition to nausea and vomiting   Darvocet [Propoxyphene N-Acetaminophen] Nausea And Vomiting and Other (See Comments)    REACTION: passes out in addition to nausea and vomiting    Current Outpatient Medications  Medication Sig Dispense Refill   naproxen (NAPROSYN) 500 MG tablet TAKE 1 TABLET BY MOUTH TWICE DAILY WITH A MEAL 60 tablet 5   aspirin 81 MG chewable tablet Chew 1 tablet by mouth daily.     atorvastatin (LIPITOR) 20 MG tablet Take 20 mg by mouth daily.     buPROPion (WELLBUTRIN XL) 150 MG 24 hr tablet Take 450 mg by mouth daily.     DULoxetine (CYMBALTA) 60 MG capsule Take 60 mg by mouth daily.     Empagliflozin (JARDIANCE PO) Take by mouth.     FLUoxetine (PROZAC) 40 MG capsule Take 80 mg by mouth daily.     gabapentin (NEURONTIN) 800 MG tablet Take 1 tablet by mouth 4 (four) times daily.     lisinopril (PRINIVIL,ZESTRIL) 20 MG tablet Take 20 mg by mouth daily.      meloxicam (MOBIC) 15 MG tablet Take 15 mg by  mouth daily.     tiZANidine (ZANAFLEX) 4 MG tablet Take 4 mg by mouth 3 (three) times daily as needed.     topiramate (TOPAMAX) 25 MG tablet      traMADol (ULTRAM) 50 MG tablet Take 1 tablet (50 mg total) by mouth every 6 (six) hours as needed. 20 tablet 0   No current facility-administered medications for this visit.     Physical Exam  Blood pressure 100/62, pulse 64, height 5\' 6"  (1.676 m), weight 266 lb (120.7 kg).  Constitutional: overall normal hygiene, normal nutrition, well developed, normal grooming, normal body habitus. Assistive device:none  Musculoskeletal: gait and station Limp none, muscle tone and strength are normal, no tremors or  atrophy is present.  .  Neurological: coordination overall normal.  Deep tendon reflex/nerve stretch intact.  Sensation normal.  Cranial nerves II-XII intact.   Skin:   Normal overall no scars, lesions, ulcers or rashes. No psoriasis.  Psychiatric: Alert and oriented x 3.  Recent memory intact, remote memory unclear.  Normal mood and affect. Well groomed.  Good eye contact.  Cardiovascular: overall no swelling, no varicosities, no edema bilaterally, normal temperatures of the legs and arms, no clubbing, cyanosis and good capillary refill.  Lymphatic: palpation is normal.  All other systems reviewed and are negative   The patient has been educated about the nature of the problem(s) and counseled on treatment options.  The patient appeared to understand what I have discussed and is in agreement with it.  Encounter Diagnoses  Name Primary?   Chronic neck pain Yes   Body mass index 40.0-44.9, adult (HCC)    Morbid obesity (HCC)     PLAN Call if any problems.  Precautions discussed.  Continue current medications.   Return to clinic 6 weeks   To neurosurgery.  Electronically Signed 12-25-1981, MD 10/5/20212:02 PM

## 2019-12-17 NOTE — Addendum Note (Signed)
Addended byArvilla Market on: 12/17/2019 02:15 PM   Modules accepted: Orders

## 2020-01-01 DIAGNOSIS — M25511 Pain in right shoulder: Secondary | ICD-10-CM | POA: Diagnosis not present

## 2020-01-01 DIAGNOSIS — M5412 Radiculopathy, cervical region: Secondary | ICD-10-CM | POA: Diagnosis not present

## 2020-01-01 DIAGNOSIS — I10 Essential (primary) hypertension: Secondary | ICD-10-CM | POA: Diagnosis not present

## 2020-01-01 DIAGNOSIS — M542 Cervicalgia: Secondary | ICD-10-CM | POA: Diagnosis not present

## 2020-01-21 DIAGNOSIS — M5412 Radiculopathy, cervical region: Secondary | ICD-10-CM | POA: Diagnosis not present

## 2020-01-21 DIAGNOSIS — G5601 Carpal tunnel syndrome, right upper limb: Secondary | ICD-10-CM | POA: Diagnosis not present

## 2020-01-22 ENCOUNTER — Other Ambulatory Visit: Payer: Self-pay | Admitting: Neurological Surgery

## 2020-01-22 DIAGNOSIS — M25511 Pain in right shoulder: Secondary | ICD-10-CM

## 2020-01-28 ENCOUNTER — Ambulatory Visit: Payer: Medicare Other | Admitting: Orthopaedic Surgery

## 2020-01-31 DIAGNOSIS — R251 Tremor, unspecified: Secondary | ICD-10-CM | POA: Diagnosis not present

## 2020-01-31 DIAGNOSIS — E119 Type 2 diabetes mellitus without complications: Secondary | ICD-10-CM | POA: Diagnosis not present

## 2020-01-31 DIAGNOSIS — E7849 Other hyperlipidemia: Secondary | ICD-10-CM | POA: Diagnosis not present

## 2020-01-31 DIAGNOSIS — I10 Essential (primary) hypertension: Secondary | ICD-10-CM | POA: Diagnosis not present

## 2020-02-10 ENCOUNTER — Other Ambulatory Visit: Payer: Self-pay

## 2020-02-10 ENCOUNTER — Other Ambulatory Visit: Payer: Medicare Other

## 2020-02-10 ENCOUNTER — Ambulatory Visit
Admission: RE | Admit: 2020-02-10 | Discharge: 2020-02-10 | Disposition: A | Discharge status: Home / Self Care | Payer: Medicare Other | Source: Ambulatory Visit | Attending: Neurological Surgery | Admitting: Neurological Surgery

## 2020-02-10 DIAGNOSIS — M25511 Pain in right shoulder: Secondary | ICD-10-CM

## 2020-02-14 DIAGNOSIS — E7849 Other hyperlipidemia: Secondary | ICD-10-CM | POA: Diagnosis not present

## 2020-02-19 DIAGNOSIS — M542 Cervicalgia: Secondary | ICD-10-CM | POA: Diagnosis not present

## 2020-02-19 DIAGNOSIS — M25511 Pain in right shoulder: Secondary | ICD-10-CM | POA: Diagnosis not present

## 2020-02-19 DIAGNOSIS — S46011A Strain of muscle(s) and tendon(s) of the rotator cuff of right shoulder, initial encounter: Secondary | ICD-10-CM | POA: Diagnosis not present

## 2020-02-19 DIAGNOSIS — M5412 Radiculopathy, cervical region: Secondary | ICD-10-CM | POA: Diagnosis not present

## 2020-03-23 DIAGNOSIS — M25511 Pain in right shoulder: Secondary | ICD-10-CM | POA: Diagnosis not present

## 2020-03-27 DIAGNOSIS — M25511 Pain in right shoulder: Secondary | ICD-10-CM | POA: Diagnosis not present

## 2020-05-08 ENCOUNTER — Encounter (HOSPITAL_BASED_OUTPATIENT_CLINIC_OR_DEPARTMENT_OTHER): Payer: Self-pay | Admitting: Orthopaedic Surgery

## 2020-05-08 ENCOUNTER — Other Ambulatory Visit: Payer: Self-pay

## 2020-05-09 ENCOUNTER — Inpatient Hospital Stay (HOSPITAL_COMMUNITY): Admission: RE | Admit: 2020-05-09 | Payer: Medicare Other | Source: Ambulatory Visit

## 2020-05-11 ENCOUNTER — Other Ambulatory Visit (HOSPITAL_COMMUNITY)
Admission: RE | Admit: 2020-05-11 | Discharge: 2020-05-11 | Disposition: A | Discharge status: Home / Self Care | Payer: Medicare Other | Source: Ambulatory Visit | Attending: Orthopaedic Surgery | Admitting: Orthopaedic Surgery

## 2020-05-11 ENCOUNTER — Encounter (HOSPITAL_BASED_OUTPATIENT_CLINIC_OR_DEPARTMENT_OTHER)
Admission: RE | Admit: 2020-05-11 | Discharge: 2020-05-11 | Disposition: A | Discharge status: Home / Self Care | Payer: Medicare Other | Source: Ambulatory Visit | Attending: Orthopaedic Surgery | Admitting: Orthopaedic Surgery

## 2020-05-11 DIAGNOSIS — Z791 Long term (current) use of non-steroidal anti-inflammatories (NSAID): Secondary | ICD-10-CM | POA: Diagnosis not present

## 2020-05-11 DIAGNOSIS — Z7982 Long term (current) use of aspirin: Secondary | ICD-10-CM | POA: Diagnosis not present

## 2020-05-11 DIAGNOSIS — Z833 Family history of diabetes mellitus: Secondary | ICD-10-CM | POA: Diagnosis not present

## 2020-05-11 DIAGNOSIS — Z01812 Encounter for preprocedural laboratory examination: Secondary | ICD-10-CM | POA: Diagnosis not present

## 2020-05-11 DIAGNOSIS — Z8249 Family history of ischemic heart disease and other diseases of the circulatory system: Secondary | ICD-10-CM | POA: Diagnosis not present

## 2020-05-11 DIAGNOSIS — Z79899 Other long term (current) drug therapy: Secondary | ICD-10-CM | POA: Diagnosis not present

## 2020-05-11 DIAGNOSIS — Z20822 Contact with and (suspected) exposure to covid-19: Secondary | ICD-10-CM | POA: Insufficient documentation

## 2020-05-11 DIAGNOSIS — Z823 Family history of stroke: Secondary | ICD-10-CM | POA: Diagnosis not present

## 2020-05-11 DIAGNOSIS — Z885 Allergy status to narcotic agent status: Secondary | ICD-10-CM | POA: Diagnosis not present

## 2020-05-11 DIAGNOSIS — Z01818 Encounter for other preprocedural examination: Secondary | ICD-10-CM | POA: Insufficient documentation

## 2020-05-11 DIAGNOSIS — M19011 Primary osteoarthritis, right shoulder: Secondary | ICD-10-CM | POA: Diagnosis not present

## 2020-05-11 DIAGNOSIS — M75122 Complete rotator cuff tear or rupture of left shoulder, not specified as traumatic: Secondary | ICD-10-CM | POA: Diagnosis not present

## 2020-05-11 LAB — BASIC METABOLIC PANEL
Anion gap: 12 (ref 5–15)
BUN: 15 mg/dL (ref 8–23)
CO2: 23 mmol/L (ref 22–32)
Calcium: 9.6 mg/dL (ref 8.9–10.3)
Chloride: 103 mmol/L (ref 98–111)
Creatinine, Ser: 0.98 mg/dL (ref 0.44–1.00)
GFR, Estimated: >60 mL/min (ref >60)
Glucose, Bld: 119 mg/dL — ABNORMAL HIGH (ref 70–99)
Potassium: 4.9 mmol/L (ref 3.5–5.1)
Sodium: 138 mmol/L (ref 135–145)

## 2020-05-11 LAB — SURGICAL PCR SCREEN
MRSA, PCR: NEGATIVE
Staphylococcus aureus: NEGATIVE
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
## 2020-05-11 NOTE — Progress Notes (Signed)
      Enhanced Recovery after Surgery for Orthopedics Enhanced Recovery after Surgery is a protocol used to improve the stress on your body and your recovery after surgery.  Patient Instructions  . The night before surgery:  o No food after midnight. ONLY clear liquids after midnight  . The day of surgery (if you do NOT have diabetes):  o Drink ONE (1) Pre-Surgery Clear Ensure as directed.   o This drink was given to you during your hospital  pre-op appointment visit. o The pre-op nurse will instruct you on the time to drink the  Pre-Surgery Ensure depending on your surgery time. o Finish the drink at the designated time by the pre-op nurse.  o Nothing else to drink after completing the  Pre-Surgery Clear Ensure.  . The day of surgery (if you have diabetes): o Drink ONE (1) Gatorade 2 (G2) as directed. o This drink was given to you during your hospital  pre-op appointment visit.  o The pre-op nurse will instruct you on the time to drink the   Gatorade 2 (G2) depending on your surgery time. o Color of the Gatorade may vary. Red is not allowed. o Nothing else to drink after completing the  Gatorade 2 (G2).         If you have questions, please contact your surgeon's office. Surgical soap given with instructions, pt verbalized understanding. Benzoyl peroxide gel given with written instructions, pt verbalized understanding.  

## 2020-05-12 LAB — SARS CORONAVIRUS 2 (TAT 6-24 HRS): SARS Coronavirus 2: NEGATIVE

## 2020-05-13 ENCOUNTER — Other Ambulatory Visit: Payer: Self-pay

## 2020-05-13 ENCOUNTER — Encounter (HOSPITAL_BASED_OUTPATIENT_CLINIC_OR_DEPARTMENT_OTHER)
Admission: RE | Disposition: A | Discharge status: Home / Self Care | Payer: Self-pay | Source: Home / Self Care | Attending: Orthopaedic Surgery

## 2020-05-13 ENCOUNTER — Encounter (HOSPITAL_BASED_OUTPATIENT_CLINIC_OR_DEPARTMENT_OTHER): Payer: Self-pay | Admitting: Orthopaedic Surgery

## 2020-05-13 ENCOUNTER — Ambulatory Visit (HOSPITAL_COMMUNITY): Payer: Medicare Other

## 2020-05-13 ENCOUNTER — Ambulatory Visit (HOSPITAL_BASED_OUTPATIENT_CLINIC_OR_DEPARTMENT_OTHER): Payer: Medicare Other | Admitting: Certified Registered"

## 2020-05-13 ENCOUNTER — Ambulatory Visit (HOSPITAL_BASED_OUTPATIENT_CLINIC_OR_DEPARTMENT_OTHER)
Admission: RE | Admit: 2020-05-13 | Discharge: 2020-05-13 | Disposition: A | Discharge status: Home / Self Care | Payer: Medicare Other | Attending: Orthopaedic Surgery | Admitting: Orthopaedic Surgery

## 2020-05-13 DIAGNOSIS — Z7982 Long term (current) use of aspirin: Secondary | ICD-10-CM | POA: Diagnosis not present

## 2020-05-13 DIAGNOSIS — M75122 Complete rotator cuff tear or rupture of left shoulder, not specified as traumatic: Secondary | ICD-10-CM | POA: Insufficient documentation

## 2020-05-13 DIAGNOSIS — E119 Type 2 diabetes mellitus without complications: Secondary | ICD-10-CM | POA: Insufficient documentation

## 2020-05-13 DIAGNOSIS — M25511 Pain in right shoulder: Secondary | ICD-10-CM | POA: Diagnosis not present

## 2020-05-13 DIAGNOSIS — R600 Localized edema: Secondary | ICD-10-CM | POA: Diagnosis not present

## 2020-05-13 DIAGNOSIS — M19012 Primary osteoarthritis, left shoulder: Secondary | ICD-10-CM | POA: Diagnosis not present

## 2020-05-13 DIAGNOSIS — Z9889 Other specified postprocedural states: Secondary | ICD-10-CM | POA: Diagnosis not present

## 2020-05-13 DIAGNOSIS — G8918 Other acute postprocedural pain: Secondary | ICD-10-CM | POA: Diagnosis not present

## 2020-05-13 DIAGNOSIS — Z885 Allergy status to narcotic agent status: Secondary | ICD-10-CM | POA: Diagnosis not present

## 2020-05-13 DIAGNOSIS — Z471 Aftercare following joint replacement surgery: Secondary | ICD-10-CM | POA: Diagnosis not present

## 2020-05-13 DIAGNOSIS — Z791 Long term (current) use of non-steroidal anti-inflammatories (NSAID): Secondary | ICD-10-CM | POA: Insufficient documentation

## 2020-05-13 DIAGNOSIS — Z8249 Family history of ischemic heart disease and other diseases of the circulatory system: Secondary | ICD-10-CM | POA: Diagnosis not present

## 2020-05-13 DIAGNOSIS — Z823 Family history of stroke: Secondary | ICD-10-CM | POA: Diagnosis not present

## 2020-05-13 DIAGNOSIS — I10 Essential (primary) hypertension: Secondary | ICD-10-CM | POA: Diagnosis not present

## 2020-05-13 DIAGNOSIS — Z09 Encounter for follow-up examination after completed treatment for conditions other than malignant neoplasm: Secondary | ICD-10-CM

## 2020-05-13 DIAGNOSIS — M19011 Primary osteoarthritis, right shoulder: Secondary | ICD-10-CM | POA: Insufficient documentation

## 2020-05-13 DIAGNOSIS — Z79899 Other long term (current) drug therapy: Secondary | ICD-10-CM | POA: Insufficient documentation

## 2020-05-13 DIAGNOSIS — Z833 Family history of diabetes mellitus: Secondary | ICD-10-CM | POA: Insufficient documentation

## 2020-05-13 DIAGNOSIS — Z96611 Presence of right artificial shoulder joint: Secondary | ICD-10-CM | POA: Diagnosis not present

## 2020-05-13 HISTORY — DX: Essential (primary) hypertension: I10

## 2020-05-13 HISTORY — PX: REVERSE SHOULDER ARTHROPLASTY: SHX5054

## 2020-05-13 HISTORY — DX: Unspecified osteoarthritis, unspecified site: M19.90

## 2020-05-13 HISTORY — DX: Type 2 diabetes mellitus without complications: E11.9

## 2020-05-13 HISTORY — DX: Depression, unspecified: F32.A

## 2020-05-13 HISTORY — DX: Anxiety disorder, unspecified: F41.9

## 2020-05-13 LAB — GLUCOSE, CAPILLARY
Glucose-Capillary: 114 mg/dL — ABNORMAL HIGH (ref 70–99)
Glucose-Capillary: 135 mg/dL — ABNORMAL HIGH (ref 70–99)

## 2020-05-13 SURGERY — ARTHROPLASTY, SHOULDER, TOTAL, REVERSE
Anesthesia: General | Site: Shoulder | Laterality: Right

## 2020-05-13 MED ORDER — PROMETHAZINE HCL 12.5 MG PO TABS
12.5000 mg | ORAL_TABLET | Freq: Four times a day (QID) | ORAL | 0 refills | Status: DC | PRN
Start: 2020-05-13 — End: 2023-05-10

## 2020-05-13 MED ORDER — ACETAMINOPHEN 325 MG PO TABS: 325.0000 mg | ORAL_TABLET | Freq: Once | ORAL | Status: DC | PRN

## 2020-05-13 MED ORDER — POVIDONE-IODINE 10 % EX SOLN
Freq: Once | CUTANEOUS | Status: AC
  Filled 2020-05-13: qty 473

## 2020-05-13 MED ORDER — EPHEDRINE 5 MG/ML INJ
INTRAVENOUS | Status: AC
  Filled 2020-05-13: qty 10

## 2020-05-13 MED ORDER — BUPIVACAINE HCL (PF) 0.5 % IJ SOLN
INTRAMUSCULAR | Status: DC | PRN
  Administered 2020-05-13: 15 mL via PERINEURAL

## 2020-05-13 MED ORDER — CEFAZOLIN SODIUM-DEXTROSE 2-4 GM/100ML-% IV SOLN
2.0000 g | INTRAVENOUS | Status: AC
  Administered 2020-05-13: 2 g via INTRAVENOUS

## 2020-05-13 MED ORDER — PHENYLEPHRINE 40 MCG/ML (10ML) SYRINGE FOR IV PUSH (FOR BLOOD PRESSURE SUPPORT)
PREFILLED_SYRINGE | INTRAVENOUS | Status: AC
  Filled 2020-05-13: qty 10

## 2020-05-13 MED ORDER — DEXAMETHASONE SODIUM PHOSPHATE 10 MG/ML IJ SOLN
INTRAMUSCULAR | Status: AC
  Filled 2020-05-13: qty 1

## 2020-05-13 MED ORDER — SUGAMMADEX SODIUM 200 MG/2ML IV SOLN
INTRAVENOUS | Status: DC | PRN
  Administered 2020-05-13: 250 mg via INTRAVENOUS

## 2020-05-13 MED ORDER — PROPOFOL 10 MG/ML IV BOLUS
INTRAVENOUS | Status: AC
  Filled 2020-05-13: qty 40

## 2020-05-13 MED ORDER — FENTANYL CITRATE (PF) 100 MCG/2ML IJ SOLN
100.0000 ug | Freq: Once | INTRAMUSCULAR | Status: AC
  Administered 2020-05-13: 50 ug via INTRAVENOUS

## 2020-05-13 MED ORDER — FENTANYL CITRATE (PF) 100 MCG/2ML IJ SOLN
INTRAMUSCULAR | Status: AC
  Filled 2020-05-13: qty 2

## 2020-05-13 MED ORDER — ROCURONIUM BROMIDE 100 MG/10ML IV SOLN
INTRAVENOUS | Status: DC | PRN
  Administered 2020-05-13: 60 mg via INTRAVENOUS

## 2020-05-13 MED ORDER — LIDOCAINE 2% (20 MG/ML) 5 ML SYRINGE
INTRAMUSCULAR | Status: AC
  Filled 2020-05-13: qty 5

## 2020-05-13 MED ORDER — VANCOMYCIN HCL 1000 MG IV SOLR
INTRAVENOUS | Status: DC | PRN
  Administered 2020-05-13: 1000 mg via TOPICAL

## 2020-05-13 MED ORDER — ONDANSETRON HCL 4 MG/2ML IJ SOLN
INTRAMUSCULAR | Status: DC | PRN
  Administered 2020-05-13: 4 mg via INTRAVENOUS

## 2020-05-13 MED ORDER — AMISULPRIDE (ANTIEMETIC) 5 MG/2ML IV SOLN: 10.0000 mg | Freq: Once | INTRAVENOUS | Status: DC | PRN

## 2020-05-13 MED ORDER — ONDANSETRON HCL 4 MG/2ML IJ SOLN
INTRAMUSCULAR | Status: AC
  Filled 2020-05-13: qty 2

## 2020-05-13 MED ORDER — DEXAMETHASONE SODIUM PHOSPHATE 10 MG/ML IJ SOLN
INTRAMUSCULAR | Status: DC | PRN
  Administered 2020-05-13: 5 mg via INTRAVENOUS

## 2020-05-13 MED ORDER — TRAMADOL HCL 50 MG PO TABS: 50.0000 mg | ORAL_TABLET | Freq: Four times a day (QID) | ORAL | 0 refills | Status: DC | PRN

## 2020-05-13 MED ORDER — TRANEXAMIC ACID-NACL 1000-0.7 MG/100ML-% IV SOLN
INTRAVENOUS | Status: AC
  Filled 2020-05-13: qty 100

## 2020-05-13 MED ORDER — VANCOMYCIN HCL 1000 MG IV SOLR
INTRAVENOUS | Status: AC
  Filled 2020-05-13: qty 2000

## 2020-05-13 MED ORDER — CELECOXIB 200 MG PO CAPS
ORAL_CAPSULE | ORAL | Status: AC
  Filled 2020-05-13: qty 2

## 2020-05-13 MED ORDER — MEPERIDINE HCL 25 MG/ML IJ SOLN: 6.2500 mg | INTRAMUSCULAR | Status: DC | PRN

## 2020-05-13 MED ORDER — ROCURONIUM BROMIDE 10 MG/ML (PF) SYRINGE
PREFILLED_SYRINGE | INTRAVENOUS | Status: AC
  Filled 2020-05-13: qty 10

## 2020-05-13 MED ORDER — CELECOXIB 200 MG PO CAPS
400.0000 mg | ORAL_CAPSULE | Freq: Once | ORAL | Status: AC
  Administered 2020-05-13: 400 mg via ORAL

## 2020-05-13 MED ORDER — MIDAZOLAM HCL 2 MG/2ML IJ SOLN
2.0000 mg | Freq: Once | INTRAMUSCULAR | Status: AC
  Administered 2020-05-13: 2 mg via INTRAVENOUS

## 2020-05-13 MED ORDER — ACETAMINOPHEN 10 MG/ML IV SOLN: 1000.0000 mg | Freq: Once | INTRAVENOUS | Status: DC | PRN

## 2020-05-13 MED ORDER — EPINEPHRINE PF 1 MG/ML IJ SOLN
INTRAMUSCULAR | Status: AC
  Filled 2020-05-13: qty 4

## 2020-05-13 MED ORDER — CEFAZOLIN SODIUM-DEXTROSE 2-4 GM/100ML-% IV SOLN
INTRAVENOUS | Status: AC
  Filled 2020-05-13: qty 100

## 2020-05-13 MED ORDER — ACETAMINOPHEN 500 MG PO TABS: 1000.0000 mg | ORAL_TABLET | Freq: Three times a day (TID) | ORAL | 0 refills | Status: AC

## 2020-05-13 MED ORDER — SUGAMMADEX SODIUM 500 MG/5ML IV SOLN
INTRAVENOUS | Status: AC
  Filled 2020-05-13: qty 5

## 2020-05-13 MED ORDER — FENTANYL CITRATE (PF) 100 MCG/2ML IJ SOLN
INTRAMUSCULAR | Status: DC | PRN
  Administered 2020-05-13: 100 ug via INTRAVENOUS

## 2020-05-13 MED ORDER — GABAPENTIN 300 MG PO CAPS
ORAL_CAPSULE | ORAL | Status: AC
  Filled 2020-05-13: qty 1

## 2020-05-13 MED ORDER — PHENYLEPHRINE HCL (PRESSORS) 10 MG/ML IV SOLN
INTRAVENOUS | Status: DC | PRN
  Administered 2020-05-13 (×6): 80 ug via INTRAVENOUS

## 2020-05-13 MED ORDER — LACTATED RINGERS IV SOLN: INTRAVENOUS | Status: DC

## 2020-05-13 MED ORDER — OXYCODONE HCL 5 MG PO TABS
5.0000 mg | ORAL_TABLET | Freq: Once | ORAL | Status: DC | PRN
Start: 2020-05-13 — End: 2020-05-13

## 2020-05-13 MED ORDER — ASPIRIN 81 MG PO CHEW: 81.0000 mg | CHEWABLE_TABLET | Freq: Two times a day (BID) | ORAL | 0 refills | Status: AC

## 2020-05-13 MED ORDER — ACETAMINOPHEN 500 MG PO TABS: 1000.0000 mg | ORAL_TABLET | Freq: Once | ORAL | Status: DC

## 2020-05-13 MED ORDER — PROPOFOL 10 MG/ML IV BOLUS
INTRAVENOUS | Status: DC | PRN
  Administered 2020-05-13: 150 mg via INTRAVENOUS

## 2020-05-13 MED ORDER — CELECOXIB 100 MG PO CAPS: 100.0000 mg | ORAL_CAPSULE | Freq: Two times a day (BID) | ORAL | 0 refills | Status: AC

## 2020-05-13 MED ORDER — LIDOCAINE 2% (20 MG/ML) 5 ML SYRINGE
INTRAMUSCULAR | Status: DC | PRN
  Administered 2020-05-13: 40 mg via INTRAVENOUS

## 2020-05-13 MED ORDER — ACETAMINOPHEN 160 MG/5ML PO SOLN: 325.0000 mg | Freq: Once | ORAL | Status: DC | PRN

## 2020-05-13 MED ORDER — FENTANYL CITRATE (PF) 100 MCG/2ML IJ SOLN: 25.0000 ug | INTRAMUSCULAR | Status: DC | PRN

## 2020-05-13 MED ORDER — EPHEDRINE SULFATE 50 MG/ML IJ SOLN
INTRAMUSCULAR | Status: DC | PRN
  Administered 2020-05-13 (×2): 10 mg via INTRAVENOUS
  Administered 2020-05-13: 25 mg via INTRAVENOUS
  Administered 2020-05-13: 5 mg via INTRAVENOUS

## 2020-05-13 MED ORDER — OMEPRAZOLE 20 MG PO CPDR: 20.0000 mg | DELAYED_RELEASE_CAPSULE | Freq: Every day | ORAL | 0 refills | Status: DC

## 2020-05-13 MED ORDER — GABAPENTIN 300 MG PO CAPS: 300.0000 mg | ORAL_CAPSULE | Freq: Once | ORAL | Status: DC

## 2020-05-13 MED ORDER — TRANEXAMIC ACID-NACL 1000-0.7 MG/100ML-% IV SOLN
1000.0000 mg | INTRAVENOUS | Status: AC
  Administered 2020-05-13: 1000 mg via INTRAVENOUS

## 2020-05-13 MED ORDER — PHENYLEPHRINE HCL (PRESSORS) 10 MG/ML IV SOLN
INTRAVENOUS | Status: AC
  Filled 2020-05-13: qty 2

## 2020-05-13 MED ORDER — BUPIVACAINE LIPOSOME 1.3 % IJ SUSP
INTRAMUSCULAR | Status: DC | PRN
  Administered 2020-05-13: 10 mL via PERINEURAL

## 2020-05-13 MED ORDER — ACETAMINOPHEN 500 MG PO TABS
ORAL_TABLET | ORAL | Status: AC
  Filled 2020-05-13: qty 2

## 2020-05-13 MED ORDER — MIDAZOLAM HCL 2 MG/2ML IJ SOLN
INTRAMUSCULAR | Status: AC
  Filled 2020-05-13: qty 2

## 2020-05-13 MED ORDER — PHENYLEPHRINE HCL-NACL 10-0.9 MG/250ML-% IV SOLN
INTRAVENOUS | Status: DC | PRN
  Administered 2020-05-13: 25 ug/min via INTRAVENOUS

## 2020-05-13 MED ORDER — OXYCODONE HCL 5 MG/5ML PO SOLN: 5.0000 mg | Freq: Once | ORAL | Status: DC | PRN

## 2020-05-13 SURGICAL SUPPLY — 66 items
BASEPLATE GLENOID RSA 3X25 0D (Shoulder) ×2 IMPLANT
BLADE HEX COATED 2.75 (ELECTRODE) IMPLANT
BLADE SAW SAG 73X25 THK (BLADE) ×1
BLADE SAW SGTL 73X25 THK (BLADE) ×1 IMPLANT
BLADE SURG 10 STRL SS (BLADE) IMPLANT
BLADE SURG 15 STRL LF DISP TIS (BLADE) IMPLANT
BLADE SURG 15 STRL SS (BLADE)
BNDG COHESIVE 4X5 TAN STRL (GAUZE/BANDAGES/DRESSINGS) IMPLANT
CHLORAPREP W/TINT 26 (MISCELLANEOUS) ×2 IMPLANT
CLSR STERI-STRIP ANTIMIC 1/2X4 (GAUZE/BANDAGES/DRESSINGS) ×2 IMPLANT
COOLER ICEMAN CLASSIC (MISCELLANEOUS) ×2 IMPLANT
COVER BACK TABLE 60X90IN (DRAPES) ×2 IMPLANT
COVER MAYO STAND STRL (DRAPES) ×2 IMPLANT
COVER WAND RF STERILE (DRAPES) IMPLANT
DECANTER SPIKE VIAL GLASS SM (MISCELLANEOUS) IMPLANT
DRAPE IMP U-DRAPE 54X76 (DRAPES) ×2 IMPLANT
DRAPE INCISE IOBAN 66X45 STRL (DRAPES) ×2 IMPLANT
DRAPE U-SHAPE 76X120 STRL (DRAPES) ×4 IMPLANT
DRSG AQUACEL AG ADV 3.5X 6 (GAUZE/BANDAGES/DRESSINGS) ×2 IMPLANT
ELECT BLADE 4.0 EZ CLEAN MEGAD (MISCELLANEOUS) ×2
ELECT REM PT RETURN 9FT ADLT (ELECTROSURGICAL) ×2
ELECTRODE BLDE 4.0 EZ CLN MEGD (MISCELLANEOUS) ×1 IMPLANT
ELECTRODE REM PT RTRN 9FT ADLT (ELECTROSURGICAL) ×1 IMPLANT
GLENOSPHERE REV SHOULDER 36 (Joint) ×2 IMPLANT
GLOVE SRG 8 PF TXTR STRL LF DI (GLOVE) ×1 IMPLANT
GLOVE SURG ENC MOIS LTX SZ6.5 (GLOVE) ×6 IMPLANT
GLOVE SURG LTX SZ8 (GLOVE) ×2 IMPLANT
GLOVE SURG UNDER POLY LF SZ6.5 (GLOVE) ×6 IMPLANT
GLOVE SURG UNDER POLY LF SZ8 (GLOVE) ×2
GOWN STRL REUS W/ TWL LRG LVL3 (GOWN DISPOSABLE) ×2 IMPLANT
GOWN STRL REUS W/TWL LRG LVL3 (GOWN DISPOSABLE) ×4
GOWN STRL REUS W/TWL XL LVL3 (GOWN DISPOSABLE) ×2 IMPLANT
GUIDE PIN 3X75 SHOULDER (PIN) ×2
GUIDEWIRE GLENOID 2.5X220 (WIRE) ×2 IMPLANT
HANDPIECE INTERPULSE COAX TIP (DISPOSABLE) ×2
INSERT REVERSED HUMERAL SIZE 1 (Orthopedic Implant) ×2 IMPLANT
KIT STABILIZATION SHOULDER (MISCELLANEOUS) ×2 IMPLANT
MANIFOLD NEPTUNE II (INSTRUMENTS) ×2 IMPLANT
PACK BASIN DAY SURGERY FS (CUSTOM PROCEDURE TRAY) ×2 IMPLANT
PACK SHOULDER (CUSTOM PROCEDURE TRAY) ×2 IMPLANT
PAD COLD SHLDR WRAP-ON (PAD) ×2 IMPLANT
PENCIL SMOKE EVACUATOR (MISCELLANEOUS) IMPLANT
PIN GUIDE 3X75 SHOULDER (PIN) ×1 IMPLANT
RESTRAINT HEAD UNIVERSAL NS (MISCELLANEOUS) ×2 IMPLANT
RETRIEVER SUT LRG (INSTRUMENTS) ×2 IMPLANT
SCREW 5.5X22 (Screw) ×2 IMPLANT
SCREW CENTRAL THREAD 6.5X45 (Screw) ×2 IMPLANT
SCREW PERIPHERAL 30 (Screw) ×2 IMPLANT
SET HNDPC FAN SPRY TIP SCT (DISPOSABLE) ×1 IMPLANT
SHEET MEDIUM DRAPE 40X70 STRL (DRAPES) ×2 IMPLANT
SLEEVE SCD COMPRESS KNEE MED (STOCKING) ×2 IMPLANT
SPONGE LAP 18X18 RF (DISPOSABLE) ×2 IMPLANT
STEM HUMERAL PLUS SHORT SZ2+ (Orthopedic Implant) ×2 IMPLANT
SUT ETHIBOND 2 V 37 (SUTURE) ×2 IMPLANT
SUT ETHIBOND NAB CT1 #1 30IN (SUTURE) ×2 IMPLANT
SUT FIBERWIRE #5 38 CONV NDL (SUTURE) ×10
SUT MNCRL AB 4-0 PS2 18 (SUTURE) IMPLANT
SUT VIC AB 0 SH 27 (SUTURE) ×2 IMPLANT
SUT VIC AB 3-0 SH 27 (SUTURE) ×2
SUT VIC AB 3-0 SH 27X BRD (SUTURE) ×1 IMPLANT
SUTURE FIBERWR #5 38 CONV NDL (SUTURE) ×5 IMPLANT
SYR BULB IRRIG 60ML STRL (SYRINGE) IMPLANT
SYS FBRTK BUTTON 2.6 (Anchor) ×2 IMPLANT
SYSTEM FBRTK BUTTON 2.6 (Anchor) ×1 IMPLANT
TOWEL GREEN STERILE FF (TOWEL DISPOSABLE) ×6 IMPLANT
TUBE SUCTION HIGH CAP CLEAR NV (SUCTIONS) ×2 IMPLANT

## 2020-05-13 NOTE — Progress Notes (Signed)
AssistedDr. Hollis with right, ultrasound guided, interscalene  block. Side rails up, monitors on throughout procedure. See vital signs in flow sheet. Tolerated Procedure well.  

## 2020-05-13 NOTE — Op Note (Addendum)
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  Patient had no remaining cuff intact essentially with only a stump of tissue that was still present on the tuberosity.  The teres was completely torn.  We had good tenodesis effect from the latissimus transfer.  Axillary nerve was checked before and after transfer to ensure that it was tension-free and intact.  Post-operative plan: The patient will be NWB in sling.  The patient will be will be discharged from PACU if continues to be stable as was plan prior to surgery.  DVT prophylaxis Aspirin 81 mg twice daily for 6 weeks.  Pain control with PRN pain medication preferring oral medicines.  Follow up plan will be scheduled in approximately 7 days for incision check and XR.  Physical therapy to start after first visit modified for latissimus dorsi transfer.  Implants: Tornier perform humeral size 2+ stem, 0 poly-, 36 glenosphere with a lateralized 3 mm baseplate and 45 center screw  Post-Op Diagnosis: Same Surgeons:Primary: Bjorn Pippin, MD Assistants:Caroline McBane PA-C Location: [facility] OR ROOM 6 Anesthesia: General with Exparel Interscalene Antibiotics: Ancef 2g preop, Vancomycin 1000mg  locally Tourniquet time: None Estimated Blood Loss: 100 Complications: None Specimens: None Implants: Implant Name Type Inv. Item Serial No. Manufacturer Lot No. LRB No. Used Action  BASEPLATE GLENOID RSA 3X25 0D - Shoulder BASEPLATE GLENOID RSA 3X25 0D  [employer] [id number] Right 1 Implanted  SCREW CENTRAL THREAD 6.5X45 - [id number] Screw SCREW CENTRAL THREAD 6.5X45  [employer]  Right 1 Implanted  SCREW  PERIPHERAL 30 - [id number] Screw SCREW PERIPHERAL 30  [employer]  Right 1 Implanted  SCREW 5.5X22 - [id number] Screw SCREW 5.5X22  [employer]  Right 1 Implanted  GLENOSPHERE REV SHOULDER 36 - [id number] Joint GLENOSPHERE REV SHOULDER 36  [employer] [id number] Right 1 Implanted  IMPLANT FIBERTAK BUTTON SYSTEM - [id number] Anchor IMPLANT FIBERTAK BUTTON SYSTEM  [employer] [id number] Right 1 Implanted  tornier perform humeral system humeral stem plus short Orthopedic Implant   [employer] [id number] Right 1 Implanted  INSERT REVERSED HUMERAL SIZE 1 - [id number] Orthopedic Implant INSERT REVERSED HUMERAL SIZE 1  [employer] [id number] Right 1 Implanted    Indications for Surgery:   Lorraine Horton is a 60 y.o. 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  Benefits and risks of operative and nonoperative management were discussed prior to surgery with patient/guardian(s) and informed consent form was completed.  Infection and need for further surgery were discussed as was prosthetic stability and cuff issues.  We additionally specifically discussed risks of axillary nerve injury, infection, periprosthetic fracture, continued pain and longevity of implants prior to beginning procedure.      Procedure:   The patient was identified in the preoperative holding area where the surgical site was marked. Block placed by anesthesia with exparel.  The patient was taken to the OR where a procedural timeout was called and the above noted anesthesia was induced.  The patient was positioned beachchair on allen table with spider arm positioner.  Preoperative antibiotics were dosed.  The  patient's right shoulder was prepped and draped in the usual sterile fashion.  A second preoperative timeout was called.       Standard deltopectoral approach was performed with a #10 blade. We dissected down to  the subcutaneous tissues and the cephalic vein was taken laterally with the deltoid. Clavipectoral fascia was incised in line with the incision. Deep retractors were placed. The long of the biceps tendon was identified and there was significant tenosynovitis present.  Tenodesis was performed to the pectoralis tendon with #2 Ethibond. The remaining biceps was followed up into the rotator interval where it was released.   The subscapularis was taken down in a full thickness layer with capsule along the humeral neck extending inferiorly around the humeral head. We continued releasing the capsule directly off of the osteophytes inferiorly all the way around the corner. This allowed [place] to dislocate the humeral head.   The humeral head had evidence of severe osteoarthritic wear with full-thickness cartilage loss and exposed subchondral bone.   The rotator cuff was carefully examined and noted to be irreperably torn.  The decision was confirmed that a reverse total shoulder was indicated for this patient.  The teres minor was also torn.  We used a 15 blade to identify the insertional fibers of the latissimus dorsi and the bicipital groove.  We able to sharply dissect and remove the insertion of latissimus dorsi tendon in its entirety.  We had good tendon length and excursion.  We whipstitch this with a #2 FiberWire and passed it around the posterior aspect of the humerus dissecting bluntly directly on bone and noting that the axillary nerve was not bound by the pass.  We confirmed this again at the end of the case.  There were osteophytes along the inferior humeral neck. The osteophytes were removed with an osteotome and a rongeur.  Osteophytes were removed with a rongeur and an osteotome and the anatomic neck was well visualized.     A humeral cutting guide was inserted down the intramedullary canal. The version was set at 20 of retroversion. Humeral osteotomy was performed with an oscillating saw. A cut  protector was placed. The humerus was retracted posteriorly and we turned our attention to glenoid exposure.  The subscapularis was again identified and immediately we took care to palpate the axillary nerve anteriorly and verify its position with gentle palpation as well as the tug test.  We then released the SGHL with bovie cautery prior to placing a curved mayo at the junction of the anterior glenoid well above the axillary nerve and bluntly dissecting the subscapularis from the capsule.  We then carefully protected the axillary nerve as we gently released the inferior capsule to fully mobilize the subscapularis.  An anterior deltoid retractor was then placed as well as a small Hohmann retractor superiorly.   The glenoid was inspected and had evidence of severe osteoarthritic wear with full-thickness cartilage loss and exposed subchondral bone. The remaining labrum was removed circumferentially taking great care not to disrupt the posterior capsule.   The glenoid drill guide was placed and used to drill a guide pin in the center, inferior position. The glenoid face was then reamed concentrically over the guide wire. The center hole was drilled over the guidepin in a near anatomic angle of version. Next the  glenoid vault was drilled back to a depth of 45 mm.  We tapped and then placed a 81mm size baseplate with 43mm lateralization was selected with a 45 mm x  6.5 mm length central screw.  The base plate was screwed into the glenoid vault obtaining secure fixation. We next placed superior and inferior locking screws for additional fixation.  Next a 36 mm glenosphere was selected and impacted onto the baseplate. The center screw was tightened.  We turned attention back to the humeral side.  Cut protector was removed and we used the Tornier perform system to complete the humeral side preparation.  We placed a guide in our center center pin.  We then used our inlay reamer to open to a size 2.  We used broaches  to size up to a 2 plus stem which achieved good fixation.  We trialed and noted that the 0 polyobtain good fixation without overtensioning.  The real humeral implants were opened after again confirming sizes.  The trial was removed. #5 Fiberwire x4 sutures passed through the humeral neck for subscap repair. The humeral component was press-fit obtaining a secure fit. A 36+0 polyethylene liner was impacted onto the stem.  The joint was reduced and thoroughly irrigated with pulsatile lavage.  We attempted repair with a Arthrex fiber tack button the latissimus dorsi transfer however the bone quality was poor laterally and were worried about it solid implants deflecting patient's humeral implant.  We instead performed a resection of soft tissue from the lateral aspect of the humerus and tenodesed the latissimus to the stump of the residual cuff and periosteum.  We had good fixation and correction of the internal rotation that the patient had prior.  We had bone tendon apposition to allow for healing.   Subscap was repaired back with #5 Fiberwire sutures through bone tunnels. Hemostasis was obtained. The deltopectoral interval was reapproximated with #1 Ethibond. The subcutaneous tissues were closed with 2-0 Vicryl and the skin was closed with running monocryl.    The wounds were cleaned and dried and an Aquacel dressing was placed. The drapes taken down. The arm was placed into sling with abduction pillow. Patient was awakened, extubated, and transferred to the recovery room in stable condition. There were no intraoperative complications. The sponge, needle, and attention counts were  correct at the end of the case.        Alfonse Alpers, PA-C, present and scrubbed throughout the case, critical for completion in a timely fashion, and for retraction, instrumentation, closure.

## 2020-05-13 NOTE — Anesthesia Preprocedure Evaluation (Addendum)
Anesthesia Evaluation  Patient identified by MRN, date of birth, ID band Patient awake    Reviewed: Allergy & Precautions, NPO status , Patient's Chart, lab work & pertinent test results  Airway Mallampati: II  TM Distance: >3 FB Neck ROM: Full    Dental  (+) Teeth Intact, Dental Advisory Given   Pulmonary    Pulmonary exam normal        Cardiovascular hypertension,  Rhythm:Regular Rate:Normal     Neuro/Psych PSYCHIATRIC DISORDERS Anxiety Depression  Neuromuscular disease    GI/Hepatic negative GI ROS, Neg liver ROS,   Endo/Other  diabetes  Renal/GU negative Renal ROS     Musculoskeletal  (+) Arthritis ,   Abdominal Normal abdominal exam  (+)   Peds  Hematology   Anesthesia Other Findings   Reproductive/Obstetrics                            Anesthesia Physical Anesthesia Plan  ASA: III  Anesthesia Plan: General   Post-op Pain Management: GA combined w/ Regional for post-op pain   Induction: Intravenous  PONV Risk Score and Plan: 4 or greater and Ondansetron, Dexamethasone, Midazolam and Scopolamine patch - Pre-op  Airway Management Planned: Oral ETT  Additional Equipment: None  Intra-op Plan:   Post-operative Plan: Extubation in OR  Informed Consent: I have reviewed the patients History and Physical, chart, labs and discussed the procedure including the risks, benefits and alternatives for the proposed anesthesia with the patient or authorized representative who has indicated his/her understanding and acceptance.     Dental advisory given  Plan Discussed with: CRNA  Anesthesia Plan Comments:        Anesthesia Quick Evaluation

## 2020-05-13 NOTE — Anesthesia Postprocedure Evaluation (Signed)
Anesthesia Post Note  Patient: Lorraine Horton  Procedure(s) Performed: RIGHT REVERSE TOTAL SHOULDER ARTHROPLASTY (Right Shoulder)     Patient location during evaluation: PACU Anesthesia Type: General Level of consciousness: awake and alert Pain management: pain level controlled Vital Signs Assessment: post-procedure vital signs reviewed and stable Respiratory status: spontaneous breathing, nonlabored ventilation, respiratory function stable and patient connected to nasal cannula oxygen Cardiovascular status: blood pressure returned to baseline and stable Postop Assessment: no apparent nausea or vomiting Anesthetic complications: no   No complications documented.  Last Vitals:  Vitals:   05/13/20 1145 05/13/20 1223  BP: 111/67 132/79  Pulse: 84 88  Resp: 10 15  Temp:  36.5 C  SpO2: 96% 95%    Last Pain:  Vitals:   05/13/20 1223  TempSrc:   PainSc: 0-No pain                 Shelton Silvas

## 2020-05-13 NOTE — Anesthesia Procedure Notes (Signed)
Anesthesia Regional Block: Interscalene brachial plexus block   Pre-Anesthetic Checklist: ,, timeout performed, Correct Patient, Correct Site, Correct Laterality, Correct Procedure, Correct Position, site marked, Risks and benefits discussed,  Surgical consent,  Pre-op evaluation,  At surgeon's request and post-op pain management  Laterality: Right  Prep: chloraprep       Needles:  Injection technique: Single-shot  Needle Type: Echogenic Stimulator Needle     Needle Length: 9cm  Needle Gauge: 21     Additional Needles:   Procedures:,,,, ultrasound used (permanent image in chart),,,,  Narrative:  Start time: 05/13/2020 8:10 AM End time: 05/13/2020 8:15 AM Injection made incrementally with aspirations every 5 mL.  Performed by: Personally  Anesthesiologist: Shelton Silvas, MD  Additional Notes: Patient tolerated the procedure well. Local anesthetic introduced in an incremental fashion under minimal resistance after negative aspirations. No paresthesias were elicited. After completion of the procedure, no acute issues were identified and patient continued to be monitored by RN.

## 2020-05-13 NOTE — Transfer of Care (Signed)
Immediate Anesthesia Transfer of Care Note  Patient: Lorraine Horton  Procedure(s) Performed: RIGHT REVERSE TOTAL SHOULDER ARTHROPLASTY (Right Shoulder)  Patient Location: PACU  Anesthesia Type:GA combined with regional for post-op pain  Level of Consciousness: drowsy  Airway & Oxygen Therapy: Patient Spontanous Breathing and Patient connected to face mask oxygen  Post-op Assessment: Report given to RN and Post -op Vital signs reviewed and stable  Post vital signs: Reviewed and stable  Last Vitals:  Vitals Value Taken Time  BP 143/79 05/13/20 1044  Temp    Pulse    Resp 13 05/13/20 1045  SpO2    Vitals shown include unvalidated device data.  Last Pain:  Vitals:   05/13/20 0711  TempSrc: Oral  PainSc: 8       Patients Stated Pain Goal: 6 (05/13/20 0711)  Complications: No complications documented.

## 2020-05-13 NOTE — Interval H&P Note (Signed)
History and Physical Interval Note:  05/13/2020 7:48 AM  Lorraine Horton  has presented today for surgery, with the diagnosis of RIGHT SHOULDER PRIMARY OSTEOARTHRITIS M19.011.  The various methods of treatment have been discussed with the patient and family. After consideration of risks, benefits and other options for treatment, the patient has consented to  Procedure(s): RIGHT REVERSE TOTAL SHOULDER ARTHROPLASTY (Right) as a surgical intervention.  The patient's history has been reviewed, patient examined, no change in status, stable for surgery.  I have reviewed the patient's chart and labs.  Questions were answered to the patient's satisfaction.     We discussed possible latissimus transfer if teres is no teres present.  We will plan for that if necessary.     Bjorn Pippin

## 2020-05-13 NOTE — Anesthesia Procedure Notes (Signed)
Procedure Name: Intubation Date/Time: 05/13/2020 8:36 AM Performed by: Lavonia Dana, CRNA Pre-anesthesia Checklist: Patient identified, Emergency Drugs available, Suction available and Patient being monitored Patient Re-evaluated:Patient Re-evaluated prior to induction Oxygen Delivery Method: Circle system utilized Preoxygenation: Pre-oxygenation with 100% oxygen Induction Type: IV induction Ventilation: Mask ventilation without difficulty Laryngoscope Size: Mac and 3 Grade View: Grade II Tube type: Oral Tube size: 7.0 mm Number of attempts: 1 Airway Equipment and Method: Stylet and Bite block Placement Confirmation: ETT inserted through vocal cords under direct vision,  positive ETCO2 and breath sounds checked- equal and bilateral Secured at: 22 cm Tube secured with: Tape Dental Injury: Teeth and Oropharynx as per pre-operative assessment

## 2020-05-13 NOTE — Discharge Instructions (Signed)
Ramond Marrow MD, MPH Alfonse Alpers, PA-C [facility] [address] [phone] (tel)   [phone] (fax)   POST-OPERATIVE INSTRUCTIONS - TOTAL SHOULDER REPLACEMENT    WOUND CARE ? You may leave the operative dressing in place until your follow-up appointment. ? KEEP THE INCISIONS CLEAN AND DRY. ? There may be a small amount of fluid/bleeding leaking at the surgical site. This is normal after surgery.  ? If it fills with liquid or blood please call us immediately to change it for you. ? Use the provided ice machine or Ice packs as often as possible for the first 3-4 days, then as needed for pain relief.  Keep a layer of cloth or a shirt between your skin and the cooling unit to prevent frost bite as it can get very cold.  SHOWERING: - You may shower on Post-Op Day #2.  - The dressing is water resistant but do not scrub it as it may start to peel up.   - You may remove the sling for showering, but keep a water resistant pillow under the arm to keep both the  elbow and shoulder away from the body (mimicking the abduction sling).  - Gently pat the area dry.  - Do not soak the shoulder in water. Do not go swimming in the pool or ocean until your sutures are removed. - KEEP THE INCISIONS CLEAN AND DRY.  EXERCISES ? Wear the sling at all times except when doing your exercises.  ? You may remove the sling for showering, but keep the arm across the chest or in a secondary sling.    ? Accidental/Purposeful External Rotation and shoulder flexion (reaching behind you) is to be avoided at all costs for the first month. ? It is ok to come out of your sling if your are sitting and have assistance for eating.   ? Do not lift anything heavier than 1 pound until we discuss it further in clinic.   REGIONAL ANESTHESIA (NERVE BLOCKS) . The anesthesia team may have performed a nerve block for you if safe in the setting of your care.  This is a great tool used to  minimize pain.  Typically the block may start wearing off overnight but the long acting medicine may last for 3-4 days.  The nerve block wearing off can be a challenging period but please utilize your as needed pain medications to try and manage this period.    POST-OP MEDICATIONS- Multimodal approach to pain control . In general your pain will be controlled with a combination of substances.  Prescriptions unless otherwise discussed are electronically sent to your pharmacy.  This is a carefully made plan we use to minimize narcotic use.     ? Celebrex - Anti-inflammatory medication taken on a scheduled basis                    - Take 1 tablet twice a day ? Acetaminophen - Non-narcotic pain medicine taken on a scheduled basis                     - Take two 500 mg tablets (1,000 mg total) every 8 hours ? Tramadol - This is a strong narcotic, to be used only on an "as needed" basis for pain.                    - Take 1 tablet as needed for severe pain not controlled by  the above ? Aspirin 81mg  - This medicine is used to minimize the risk of blood clots after surgery.                    - Take 1 tablet twice a day for DVT prophylaxis after surgery ? Omeprazole - daily medicine to protect your stomach while taking anti-inflammatories.   ? Phenergan -  take as needed for nausea   FOLLOW-UP ? If you develop a Fever (>101.5), Redness or Drainage from the surgical incision site, please call our office to arrange for an evaluation. ? Please call the office to schedule a follow-up appointment for a wound check, 7-10 days post-operatively.  IF YOU HAVE ANY QUESTIONS, PLEASE FEEL FREE TO CALL OUR OFFICE.  HELPFUL INFORMATION  . If you had a block, it will wear off between 8-24 hrs postop typically.  This is period when your pain may go from nearly zero to the pain you would have had post-op without the block.  This is an abrupt transition but nothing dangerous is happening.  You may take an extra dose of  narcotic when this happens.  ? Your arm will be in a sling following surgery. You will be in this sling for the next 3-4 weeks.  I will let you know the exact duration at your follow-up visit.  ? You may be more comfortable sleeping in a semi-seated position the first few nights following surgery.  Keep a pillow propped under the elbow and forearm for comfort.  If you have a recliner type of chair it might be beneficial.  If not that is fine too, but it would be helpful to sleep propped up with pillows behind your operated shoulder as well under your elbow and forearm.  This will reduce pulling on the suture lines.  ? When dressing, put your operative arm in the sleeve first.  When getting undressed, take your operative arm out last.  Loose fitting, button-down shirts are recommended.  ? In most states it is against the law to drive while your arm is in a sling. And certainly against the law to drive while taking narcotics.  ? You may return to work/school in the next couple of days when you feel up to it. Desk work and typing in the sling is fine.  ? We suggest you use the pain medication the first night prior to going to bed, in order to ease any pain when the anesthesia wears off. You should avoid taking pain medications on an empty stomach as it will make you nauseous.  ? Do not drink alcoholic beverages or take illicit drugs when taking pain medications.  ? Pain medication may make you constipated.  Below are a few solutions to try in this order: - Decrease the amount of pain medication if you aren't having pain. - Drink lots of decaffeinated fluids. - Drink prune juice and/or each dried prunes  o If the first 3 don't work start with additional solutions - Take Colace - an over-the-counter stool softener - Take Senokot - an over-the-counter laxative - Take Miralax - a stronger over-the-counter laxative   Dental Antibiotics:  In most cases prophylactic antibiotics for Dental  procdeures after total joint surgery are not necessary.  Exceptions are as follows:  1. History of prior total joint infection  2. Severely immunocompromised (Organ Transplant, cancer chemotherapy, Rheumatoid biologic meds such as Humera)  3. Poorly controlled diabetes (A1C &gt; 8.0, blood glucose over 200)  If you  have one of these conditions, contact your surgeon for an antibiotic prescription, prior to your dental procedure.   For more information including helpful videos and documents visit our website:   [email or web address]    Post Anesthesia Home Care Instructions  Activity: Get plenty of rest for the remainder of the day. A responsible individual must stay with you for 24 hours following the procedure.  For the next 24 hours, DO NOT: -Drive a car -Advertising copywriter -Drink alcoholic beverages -Take any medication unless instructed by your physician -Make any legal decisions or sign important papers.  Meals: Start with liquid foods such as gelatin or soup. Progress to regular foods as tolerated. Avoid greasy, spicy, heavy foods. If nausea and/or vomiting occur, drink only clear liquids until the nausea and/or vomiting subsides. Call your physician if vomiting continues.  Special Instructions/Symptoms: Your throat may feel dry or sore from the anesthesia or the breathing tube placed in your throat during surgery. If this causes discomfort, gargle with warm salt water. The discomfort should disappear within 24 hours.  If you had a scopolamine patch placed behind your ear for the management of post- operative nausea and/or vomiting:  1. The medication in the patch is effective for 72 hours, after which it should be removed.  Wrap patch in a tissue and discard in the trash. Wash hands thoroughly with soap and water. 2. You may remove the patch earlier than 72 hours if you experience unpleasant side effects which may include dry mouth,  dizziness or visual disturbances. 3. Avoid touching the patch. Wash your hands with soap and water after contact with the patch.  Information for Discharge Teaching: EXPAREL (bupivacaine liposome injectable suspension)   Your surgeon or anesthesiologist gave you EXPAREL(bupivacaine) to help control your pain after surgery.   EXPAREL is a local anesthetic that provides pain relief by numbing the tissue around the surgical site.  EXPAREL is designed to release pain medication over time and can control pain for up to 72 hours.  Depending on how you respond to EXPAREL, you may require less pain medication during your recovery.  Possible side effects:  Temporary loss of sensation or ability to move in the area where bupivacaine was injected.  Nausea, vomiting, constipation  Rarely, numbness and tingling in your mouth or lips, lightheadedness, or anxiety may occur.  Call your doctor right away if you think you may be experiencing any of these sensations, or if you have other questions regarding possible side effects.  Follow all other discharge instructions given to you by your surgeon or nurse. Eat a healthy diet and drink plenty of water or other fluids.  If you return to the hospital for any reason within 96 hours following the administration of EXPAREL, it is important for health care providers to know that you have received this anesthetic. A teal colored band has been placed on your arm with the date, time and amount of EXPAREL you have received in order to alert and inform your health care providers. Please leave this armband in place for the full 96 hours following administration, and then you may remove the band.    You may take Ibuprofen again at 1:20pm.

## 2020-05-14 ENCOUNTER — Encounter (HOSPITAL_BASED_OUTPATIENT_CLINIC_OR_DEPARTMENT_OTHER): Payer: Self-pay | Admitting: Orthopaedic Surgery

## 2020-05-21 DIAGNOSIS — M19011 Primary osteoarthritis, right shoulder: Secondary | ICD-10-CM | POA: Diagnosis not present

## 2020-05-26 DIAGNOSIS — M19011 Primary osteoarthritis, right shoulder: Secondary | ICD-10-CM | POA: Diagnosis not present

## 2020-05-29 ENCOUNTER — Encounter (HOSPITAL_COMMUNITY): Payer: Self-pay | Admitting: Specialist

## 2020-05-29 ENCOUNTER — Ambulatory Visit (HOSPITAL_COMMUNITY): Payer: Medicare Other | Attending: Orthopaedic Surgery | Admitting: Specialist

## 2020-05-29 ENCOUNTER — Other Ambulatory Visit: Payer: Self-pay

## 2020-05-29 DIAGNOSIS — R29898 Other symptoms and signs involving the musculoskeletal system: Secondary | ICD-10-CM | POA: Diagnosis not present

## 2020-05-29 DIAGNOSIS — M25511 Pain in right shoulder: Secondary | ICD-10-CM

## 2020-05-29 DIAGNOSIS — M25611 Stiffness of right shoulder, not elsewhere classified: Secondary | ICD-10-CM
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
## 2020-05-29 NOTE — Patient Instructions (Signed)
1) SHOULDER: Flexion On Table   Place hands on towel placed on table, elbows straight. Lean forward with you upper body, pushing towel away from body.  ___ reps per set, ___ sets per day  2) Abduction (Passive)   With arm out to side, resting on towel placed on table with palm DOWN, keeping trunk away from table, lean to the side while pushing towel away from body.  Repeat ____ times. Do ____ sessions per day.  Copyright  VHI. All rights reserved.     AROM: Elbow Flexion / Extension    With left hand palm up, gently bend elbow as far as possible. Then straighten arm as far as possible. Repeat ____ times per set. Do ____ sets per session. Do ____ sessions per day.  Copyright  VHI. All rights reserved.  Elbow / Wrist Supination / Pronation    Bend elbows to 90 and hold close to body. Turn both palms up. Then turn palms down. Keep wrist straight. Repeat sequence ____ times per session. Do ____ sessions per week.  Copyright  VHI. All rights reserved.  Wrist Flexion / Extension    Hold elbows at 90 and close to body, with palms down. Bend both wrists so fingers point up. Then bend wrist so fingers point down. Repeat sequence ____ times per session. Do ____ sessions per week. Hand Variation: Thumbs up   Copyright  VHI. All rights reserved.

## 2020-06-03 ENCOUNTER — Other Ambulatory Visit: Payer: Self-pay

## 2020-06-03 ENCOUNTER — Ambulatory Visit (HOSPITAL_COMMUNITY): Payer: Medicare Other

## 2020-06-03 ENCOUNTER — Encounter (HOSPITAL_COMMUNITY): Payer: Self-pay

## 2020-06-03 DIAGNOSIS — R29898 Other symptoms and signs involving the musculoskeletal system: Secondary | ICD-10-CM | POA: Diagnosis not present

## 2020-06-03 DIAGNOSIS — M25511 Pain in right shoulder: Secondary | ICD-10-CM

## 2020-06-03 DIAGNOSIS — M25611 Stiffness of right shoulder, not elsewhere classified: Secondary | ICD-10-CM

## 2020-06-03 NOTE — Therapy (Signed)
[facility] [address] [place], [place], [address] Phone: [phone]   Fax:  [phone]  Occupational Therapy Treatment  Patient Details  Name: Lorraine Horton MRN: [id number] Date of Birth: 1960-08-06 Referring Provider (OT): Dr. Ramond Marrow   Encounter Date: 06/03/2020   OT End of Session - 06/03/20 1516    Visit Number 2    Number of Visits 16    Date for OT Re-Evaluation 07/24/20   06/26/20   Authorization Type [employer] Medicare    Progress Note Due on Visit 10    OT Start Time 1345    OT Stop Time 1425    OT Time Calculation (min) 40 min    Activity Tolerance Patient tolerated treatment well    Behavior During Therapy [facility] for tasks assessed/performed           Past Medical History:  Diagnosis Date  . AC (acromioclavicular) joint bone spurs   . Anxiety   . Arthritis    chronic back, shoulder, knees  . Bursitis   . Depression   . Diabetes mellitus without complication (HCC)   . Hypertension    no meds now    Past Surgical History:  Procedure Laterality Date  . CHOLECYSTECTOMY  1996  . KNEE SURGERY Bilateral   . REVERSE SHOULDER ARTHROPLASTY Right 05/13/2020   Procedure: RIGHT REVERSE TOTAL SHOULDER ARTHROPLASTY;  Surgeon: Bjorn Pippin, MD;  Location: [facility];  Service: Orthopedics;  Laterality: Right;  . TUBAL LIGATION  1992    There were no vitals filed for this visit.   Subjective Assessment - 06/03/20 1350    Subjective  S: It is sore today.    Currently in Pain? 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   Row --   No not complete per protocol.   Other Seated Exercises Scapular depression; 10X; A/ROM    Other Seated Exercises thumb tacks/door knob turns with arm extended towards  floor while seated. 1'      Manual Therapy   Manual Therapy Myofascial release    Manual therapy comments manual therapy completed seperately than all other interventions    Myofascial Release manual therapy to right arm, shoulder, scapular region to decrease fascial restrictions and pain                  OT Education - 06/03/20 1429    Education Details reviewed therapy goals. reviewed protocol.    Person(s) Educated Patient    Methods Explanation    Comprehension Verbalized understanding            OT Short Term Goals - 06/03/20 1352      OT SHORT TERM GOAL #1   Title Paitent will be educated and independent with HEP for improved RUE mobility and strength.    Time 4    Period Weeks    Status On-going    Target Date 06/26/20      OT SHORT TERM GOAL #2   Title Patient will improve right shoulder P/ROM to [facility] for improved independence with dressing and bathing.    Time 4    Period Weeks    Status On-going      OT SHORT TERM GOAL #3   Title Patient will  improve right shoulder strength to 3+/5 or better for improved ability to lift grocery bags.    Time 4    Period Weeks    Status On-going      OT SHORT TERM GOAL #4   Title Patient will decrease pain in her right shoulder to 5/10 or better when sleeping.    Time 4    Period Weeks    Status On-going             OT Long Term Goals - 06/03/20 1352      OT LONG TERM GOAL #1   Title Patient will return to PLOF with all B/IADLs using right upper extremity as dominant.    Time 8    Period Weeks    Status On-going      OT LONG TERM GOAL #2   Title Patient will improve right shoulder A/ROM to [facility] for improved ability to reach overhead and behind her back during ADL tasks.    Time 8    Period Weeks    Status On-going      OT LONG TERM GOAL #3   Title Patient will improve right shoulder strength to 4+/5 or better for improved abiilty to lift laundry baskets.    Time 8    Period Weeks    Status On-going      OT LONG TERM GOAL #4   Title Patient will decrease fascial restrictions to minimal or less in her right shoulder region.    Time 8    Period Weeks    Status On-going      OT LONG TERM GOAL #5   Title Patient will have 3/10 pain or less in her right shoulder region while completing ADL tasks.    Time 8    Period Weeks    Status On-going                 Plan - 06/03/20 1518    Clinical Impression Statement A: Provided 3 towel rolls under right elbow while completing manual techniques for comfort and pain management. Manual techniques completed to right UE proximal to elbow up to shoulder and upper trapezius region. Gentle pressure used due  to increased tenderness. Max fascial restrictions noted near healed incision at anterior shoulder. VC for form and technique were provided during session. Education provided on protocol and precautions with RUE.    Body Structure / Function / Physical Skills ADL;Decreased knowledge of use of DME;Strength;Pain;UE functional  use;IADL;ROM;Fascial restriction;Muscle spasms;Decreased knowledge of precautions;Skin integrity    Plan P: Place 2-3 towels under elbow while completing myofascial release for comfort if needed. Continue with P/ROM. Add low level thumb tasks at wall, therapy ball stretches.    Consulted and Agree with Plan of Care Patient           Patient will benefit from skilled therapeutic intervention in order to improve the following deficits and impairments:   Body Structure / Function / Physical Skills: ADL,Decreased knowledge of use of DME,Strength,Pain,UE functional use,IADL,ROM,Fascial restriction,Muscle spasms,Decreased knowledge of precautions,Skin integrity       Visit Diagnosis: Acute pain of right shoulder  Other symptoms and signs involving the musculoskeletal system  Stiffness of right shoulder, not elsewhere classified    Problem List Patient Active Problem List   Diagnosis Date Noted  . Bilateral low back pain with sciatica 08/24/2015    Limmie Patricia, OTR/L,CBIS  [phone]  06/03/2020, 3:25 PM  [facility] [address] [place], [place], [address] Phone: [phone]   Fax:  [phone]  Name: Lorraine Horton MRN: [id number] Date of Birth: 1960-09-12

## 2020-06-08 ENCOUNTER — Ambulatory Visit (HOSPITAL_COMMUNITY): Payer: Medicare Other | Admitting: Occupational Therapy

## 2020-06-08 ENCOUNTER — Encounter (HOSPITAL_COMMUNITY): Payer: Self-pay | Admitting: Occupational Therapy

## 2020-06-08 ENCOUNTER — Other Ambulatory Visit: Payer: Self-pay

## 2020-06-08 DIAGNOSIS — M25511 Pain in right shoulder: Secondary | ICD-10-CM

## 2020-06-08 DIAGNOSIS — M25611 Stiffness of right shoulder, not elsewhere classified: Secondary | ICD-10-CM

## 2020-06-08 DIAGNOSIS — R29898 Other symptoms and signs involving the musculoskeletal system: Secondary | ICD-10-CM

## 2020-06-08 NOTE — Therapy (Signed)
[facility] [address] [place], [place], [address] Phone: [phone]   Fax:  [phone]  Occupational Therapy Treatment  Patient Details  Name: Lorraine Horton MRN: [id number] Date of Birth: [birth year] Referring Provider (OT): Dr. Ramond Marrow   Encounter Date: 06/08/2020   OT End of Session - 06/08/20 1614    Visit Number 3    Number of Visits 16    Date for OT Re-Evaluation 07/24/20   06/26/20   Authorization Type [employer] Medicare    Progress Note Due on Visit 10    OT Start Time 1517    OT Stop Time 1602    OT Time Calculation (min) 45 min    Activity Tolerance Patient tolerated treatment well    Behavior During Therapy [facility] for tasks assessed/performed           Past Medical History:  Diagnosis Date  . AC (acromioclavicular) joint bone spurs   . Anxiety   . Arthritis    chronic back, shoulder, knees  . Bursitis   . Depression   . Diabetes mellitus without complication (HCC)   . Hypertension    no meds now    Past Surgical History:  Procedure Laterality Date  . CHOLECYSTECTOMY  1996  . KNEE SURGERY Bilateral   . REVERSE SHOULDER ARTHROPLASTY Right 05/13/2020   Procedure: RIGHT REVERSE TOTAL SHOULDER ARTHROPLASTY;  Surgeon: Bjorn Pippin, MD;  Location: [facility];  Service: Orthopedics;  Laterality: Right;  . TUBAL LIGATION  1992    There were no vitals filed for this visit.   Subjective Assessment - 06/08/20 1517    Subjective  S: It hurts. It hurts more because I tried to catch a rug that dropped today.    Currently in Pain? Yes    Pain Score 7     Pain Location Shoulder    Pain Orientation Right    Pain Descriptors / Indicators Aching;Sharp    Pain Type Acute pain    Pain Radiating Towards upper arm    Pain Onset More than a month ago    Pain Frequency Constant    Aggravating Factors  movement and use    Pain Relieving Factors horse linament, tramadol    Effect of Pain on Daily Activities max  effect.    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   ABduction PROM;10 reps   Limited to ~90 degrees.     Shoulder Exercises: Stretch   Other Shoulder Stretches large theraball stretches x5 flexion and abduction; completed seated in chair.  Manual Therapy   Manual Therapy Myofascial release    Manual therapy comments manual therapy completed seperately than all other interventions    Myofascial Release manual therapy to right arm, shoulder, scapular region to decrease fascial restrictions and pain                  OT Education - 06/08/20 1613    Education Details Discussed prupose of P/ROM.    Person(s) Educated Patient    Methods Explanation    Comprehension Verbalized understanding            OT Short Term Goals - 06/03/20 1352      OT SHORT TERM GOAL #1   Title Paitent will be educated and independent with HEP for improved RUE mobility and strength.    Time 4    Period Weeks    Status On-going    Target Date 06/26/20      OT SHORT TERM GOAL #2   Title Patient will improve right shoulder P/ROM to [facility] for improved independence with dressing and bathing.    Time 4    Period Weeks    Status On-going      OT SHORT TERM GOAL #3   Title Patient will improve right shoulder  strength to 3+/5 or better for improved ability to lift grocery bags.    Time 4    Period Weeks    Status On-going      OT SHORT TERM GOAL #4   Title Patient will decrease pain in her right shoulder to 5/10 or better when sleeping.    Time 4    Period Weeks    Status On-going             OT Long Term Goals - 06/03/20 1352      OT LONG TERM GOAL #1   Title Patient will return to PLOF with all B/IADLs using right upper extremity as dominant.    Time 8    Period Weeks    Status On-going      OT LONG TERM GOAL #2   Title Patient will improve right shoulder A/ROM to [facility] for improved ability to reach overhead and behind her back during ADL tasks.    Time 8    Period Weeks    Status On-going      OT LONG TERM GOAL #3   Title Patient will improve right shoulder strength to 4+/5 or better for improved abiilty to lift laundry baskets.    Time 8    Period Weeks    Status On-going      OT LONG TERM GOAL #4   Title Patient will decrease fascial restrictions to minimal or less in her right shoulder region.    Time 8    Period Weeks    Status On-going      OT LONG TERM GOAL #5   Title Patient will have 3/10 pain or less in her right shoulder region while completing ADL tasks.    Time 8    Period Weeks    Status On-going                 Plan - 06/08/20 1615    Clinical Impression Statement Provided 2 towels under right elbow during manual techniques and P/ROM this day. Manual techniques ocmpleted to R UE proximal elbow to shouler and upper rapezius region. Maximal fascial restrictions primarily in promimal upper arm and shoulder near incision. Noted to reach ~90* during P/ROM for shoulder  flexion. Able to achieve slightly more than 90* flexion and abductoin during theraball stretches in seated.    Body Structure / Function / Physical Skills ADL;Decreased knowledge of use of DME;Strength;Pain;UE functional use;IADL;ROM;Fascial restriction;Muscle spasms;Decreased knowledge of  precautions;Skin integrity    OT Treatment/Interventions Self-care/ADL training;Electrical Stimulation;Therapeutic exercise;Patient/family education;Neuromuscular education;Moist Heat;Therapeutic activities;Passive range of motion;Manual Therapy;Ultrasound;Cryotherapy;DME and/or AE instruction    Plan P: Place 2-3 towels under elbow during myofascial release if needed again. Continue P/ROM. Add lower level thumb tacks. Continue use of large theraball stretches. See protocal and attempt to continue progression of P/ROM in R shoulder.    OT Home Exercise Plan eval:table slides and elbow- wrist A/ROM    Consulted and Agree with Plan of Care Patient           Patient will benefit from skilled therapeutic intervention in order to improve the following deficits and impairments:   Body Structure / Function / Physical Skills: ADL,Decreased knowledge of use of DME,Strength,Pain,UE functional use,IADL,ROM,Fascial restriction,Muscle spasms,Decreased knowledge of precautions,Skin integrity       Visit Diagnosis: Acute pain of right shoulder  Other symptoms and signs involving the musculoskeletal system  Stiffness of right shoulder, not elsewhere classified    Problem List Patient Active Problem List   Diagnosis Date Noted  . Bilateral low back pain with sciatica 08/24/2015   Danie Chandler OT, MOT   Danie Chandler 06/08/2020, 4:20 PM  [facility] [address] [place], [place], [address] Phone: [phone]   Fax:  [phone]  Name: Lorraine Horton MRN: [id number] Date of Birth: Sep 02, 1960

## 2020-06-10 ENCOUNTER — Other Ambulatory Visit: Payer: Self-pay

## 2020-06-10 ENCOUNTER — Ambulatory Visit (HOSPITAL_COMMUNITY): Payer: Medicare Other

## 2020-06-10 ENCOUNTER — Encounter (HOSPITAL_COMMUNITY): Payer: Self-pay

## 2020-06-10 DIAGNOSIS — R29898 Other symptoms and signs involving the musculoskeletal system: Secondary | ICD-10-CM | POA: Diagnosis not present

## 2020-06-10 DIAGNOSIS — M25511 Pain in right shoulder: Secondary | ICD-10-CM

## 2020-06-10 DIAGNOSIS — M25611 Stiffness of right shoulder, not elsewhere classified: Secondary | ICD-10-CM

## 2020-06-10 NOTE — Therapy (Signed)
[facility] [address] [place], [place], [address] Phone: [phone]   Fax:  [phone]  Occupational Therapy Treatment  Patient Details  Name: Lorraine Horton MRN: [id number] Date of Birth: [birth year] Referring Provider (OT): Dr. Ramond Marrow   Encounter Date: 06/10/2020   OT End of Session - 06/10/20 1651    Visit Number 4    Number of Visits 16    Date for OT Re-Evaluation 07/24/20   06/26/20   Authorization Type [employer] Medicare    Progress Note Due on Visit 10    OT Start Time 1515    OT Stop Time 1556    OT Time Calculation (min) 41 min    Activity Tolerance Patient tolerated treatment well    Behavior During Therapy [facility] for tasks assessed/performed           Past Medical History:  Diagnosis Date  . AC (acromioclavicular) joint bone spurs   . Anxiety   . Arthritis    chronic back, shoulder, knees  . Bursitis   . Depression   . Diabetes mellitus without complication (HCC)   . Hypertension    no meds now    Past Surgical History:  Procedure Laterality Date  . CHOLECYSTECTOMY  1996  . KNEE SURGERY Bilateral   . REVERSE SHOULDER ARTHROPLASTY Right 05/13/2020   Procedure: RIGHT REVERSE TOTAL SHOULDER ARTHROPLASTY;  Surgeon: Bjorn Pippin, MD;  Location: [facility];  Service: Orthopedics;  Laterality: Right;  . TUBAL LIGATION  1992    There were no vitals filed for this visit.   Subjective Assessment - 06/10/20 1520    Currently in Pain? Yes    Pain Score 7     Pain Location Shoulder    Pain Orientation Right    Pain Descriptors / Indicators Aching;Sharp    Pain Type Acute pain    Pain Onset More than a month ago    Pain Frequency Constant    Aggravating Factors  movement and use    Pain Relieving Factors horse and use    Effect of Pain on Daily Activities max effect    Multiple Pain Sites No              [facility] OT Assessment - 06/10/20 1523      Assessment   Medical Diagnosis S/P Right  Reverse Total Shoulder Replacement with Latissimus Dorsi Transfer      Precautions   Precautions Shoulder    Type of Shoulder Precautions (located in media tab) Please see scanned protcol for specific guidelines **avoid IR/backwards extension for 3 months      ROM / Strength   AROM / PROM / Strength PROM      PROM   Overall PROM Comments assessed in supine, external rotation with shoulder adducted    PROM Assessment Site Shoulder    Right/Left Shoulder Right    Right Shoulder Flexion 140 Degrees   previous: 45   Right Shoulder ABduction 115 Degrees   previous: 50   Right Shoulder Internal Rotation 90 Degrees   previous: 50   Right Shoulder External Rotation 40 Degrees   previous: 0                   OT Treatments/Exercises (OP) - 06/10/20 1522      Exercises   Exercises Shoulder      Shoulder Exercises: Supine   Protraction PROM;AAROM;10 reps    Horizontal ABduction PROM;10 reps  External Rotation PROM;10 reps    Internal Rotation PROM;10 reps    Flexion PROM;AAROM;10 reps    ABduction PROM;10 reps      Manual Therapy   Manual Therapy Myofascial release    Manual therapy comments manual therapy completed seperately than all other interventions    Myofascial Release manual therapy to right arm, shoulder, scapular region to decrease fascial restrictions and pain                    OT Short Term Goals - 06/03/20 1352      OT SHORT TERM GOAL #1   Title Paitent will be educated and independent with HEP for improved RUE mobility and strength.    Time 4    Period Weeks    Status On-going    Target Date 06/26/20      OT SHORT TERM GOAL #2   Title Patient will improve right shoulder P/ROM to [facility] for improved independence with dressing and bathing.    Time 4    Period Weeks    Status On-going      OT SHORT TERM GOAL #3   Title Patient will improve right shoulder strength to 3+/5 or better for improved ability to lift grocery bags.    Time 4     Period Weeks    Status On-going      OT SHORT TERM GOAL #4   Title Patient will decrease pain in her right shoulder to 5/10 or better when sleeping.    Time 4    Period Weeks    Status On-going             OT Long Term Goals - 06/03/20 1352      OT LONG TERM GOAL #1   Title Patient will return to PLOF with all B/IADLs using right upper extremity as dominant.    Time 8    Period Weeks    Status On-going      OT LONG TERM GOAL #2   Title Patient will improve right shoulder A/ROM to [facility] for improved ability to reach overhead and behind her back during ADL tasks.    Time 8    Period Weeks    Status On-going      OT LONG TERM GOAL #3   Title Patient will improve right shoulder strength to 4+/5 or better for improved abiilty to lift laundry baskets.    Time 8    Period Weeks    Status On-going      OT LONG TERM GOAL #4   Title Patient will decrease fascial restrictions to minimal or less in her right shoulder region.    Time 8    Period Weeks    Status On-going      OT LONG TERM GOAL #5   Title Patient will have 3/10 pain or less in her right shoulder region while completing ADL tasks.    Time 8    Period Weeks    Status On-going                 Plan - 06/10/20 1652    Clinical Impression Statement A: Completed myofascial release to right forearm, bicep, upper arm, and trapezius region to decrease fascial restrictions. patient demonstrates good response to technique. Measurements taken for MD follow up tomorrow with patient demonstrating significant improvement with shoulder P/ROM in all ranges. Pt is now 4 weeks post op and per protocol AA/ROMwas initiated supine. Pt was able to demonstrate  further shoulder flexion when completing AA/ROM and updated measurement was taken. VC and physical cues provided for form and technique during exercises.    Body Structure / Function / Physical Skills ADL;Decreased knowledge of use of DME;Strength;Pain;UE functional  use;IADL;ROM;Fascial restriction;Muscle spasms;Decreased knowledge of precautions;Skin integrity    Plan P: Complete supine AA/ROM. pulleys. Update HEP.    Consulted and Agree with Plan of Care Patient           Patient will benefit from skilled therapeutic intervention in order to improve the following deficits and impairments:   Body Structure / Function / Physical Skills: ADL,Decreased knowledge of use of DME,Strength,Pain,UE functional use,IADL,ROM,Fascial restriction,Muscle spasms,Decreased knowledge of precautions,Skin integrity       Visit Diagnosis: Acute pain of right shoulder  Stiffness of right shoulder, not elsewhere classified  Other symptoms and signs involving the musculoskeletal system    Problem List Patient Active Problem List   Diagnosis Date Noted  . Bilateral low back pain with sciatica 08/24/2015    Limmie Patricia, OTR/L,CBIS  [phone]  06/10/2020, 4:56 PM  [facility] [address] [place], [place], [address] Phone: [phone]   Fax:  [phone]  Name: Lorraine Horton MRN: [id number] Date of Birth: 03/06/1961

## 2020-06-11 DIAGNOSIS — M19011 Primary osteoarthritis, right shoulder: Secondary | ICD-10-CM | POA: Diagnosis not present

## 2020-06-15 ENCOUNTER — Telehealth (HOSPITAL_COMMUNITY): Payer: Self-pay

## 2020-06-15 ENCOUNTER — Ambulatory Visit (HOSPITAL_COMMUNITY): Payer: Medicare Other

## 2020-06-15 NOTE — Telephone Encounter (Signed)
pt cancelled appt for today because she is sick 

## 2020-06-17 ENCOUNTER — Other Ambulatory Visit: Payer: Self-pay

## 2020-06-17 ENCOUNTER — Ambulatory Visit (HOSPITAL_COMMUNITY): Payer: Medicare Other | Attending: Orthopaedic Surgery

## 2020-06-17 ENCOUNTER — Encounter (HOSPITAL_COMMUNITY): Payer: Self-pay

## 2020-06-17 DIAGNOSIS — R29898 Other symptoms and signs involving the musculoskeletal system: Secondary | ICD-10-CM | POA: Insufficient documentation

## 2020-06-17 DIAGNOSIS — M25511 Pain in right shoulder: Secondary | ICD-10-CM | POA: Insufficient documentation

## 2020-06-17 DIAGNOSIS — M25611 Stiffness of right shoulder, not elsewhere classified: Secondary | ICD-10-CM | POA: Diagnosis not present

## 2020-06-17 NOTE — Patient Instructions (Signed)

## 2020-06-17 NOTE — Therapy (Signed)
[facility] [address] [place], [place], [address] Phone: [phone]   Fax:  [phone]  Occupational Therapy Treatment  Patient Details  Name: Lorraine Horton MRN: [id number] Date of Birth: Feb 13, 1961 Referring Provider (OT): Dr. Ramond Marrow   Encounter Date: 06/17/2020   OT End of Session - 06/17/20 1552    Visit Number 5    Number of Visits 16    Date for OT Re-Evaluation 07/24/20   06/26/20   Authorization Type [employer] Medicare    Progress Note Due on Visit 10    OT Start Time 1530    OT Stop Time 1605    OT Time Calculation (min) 35 min    Activity Tolerance Patient tolerated treatment well    Behavior During Therapy [facility] for tasks assessed/performed           Past Medical History:  Diagnosis Date  . AC (acromioclavicular) joint bone spurs   . Anxiety   . Arthritis    chronic back, shoulder, knees  . Bursitis   . Depression   . Diabetes mellitus without complication (HCC)   . Hypertension    no meds now    Past Surgical History:  Procedure Laterality Date  . CHOLECYSTECTOMY  1996  . KNEE SURGERY Bilateral   . REVERSE SHOULDER ARTHROPLASTY Right 05/13/2020   Procedure: RIGHT REVERSE TOTAL SHOULDER ARTHROPLASTY;  Surgeon: Bjorn Pippin, MD;  Location: [facility];  Service: Orthopedics;  Laterality: Right;  . TUBAL LIGATION  1992    There were no vitals filed for this visit.   Subjective Assessment - 06/17/20 1541    Subjective  S: I saw the doctor the other day and she was surprised by how well my arm was doing.    Currently in Pain? 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      Precautions   Precautions Shoulder    Type of Shoulder Precautions (located in media tab) Please see scanned protcol for specific guidelines **avoid IR/backwards extension for 3 months                    OT Treatments/Exercises (OP) - 06/17/20 1549      Exercises   Exercises Shoulder      Shoulder Exercises: Supine   Protraction PROM;AAROM;10 reps    Horizontal ABduction PROM;AAROM;10 reps    External Rotation PROM;AAROM;10 reps    Internal Rotation PROM;AAROM;10 reps    Flexion PROM;AAROM;10 reps    ABduction PROM;AAROM;10 reps      Shoulder Exercises: Standing   Protraction AAROM;10 reps    Horizontal ABduction AAROM;10 reps    External Rotation AAROM;10 reps    Internal Rotation AAROM;10 reps    Flexion AAROM;10 reps    ABduction AAROM;10 reps  Shoulder Exercises: Pulleys   Flexion 1 minute   standing     Shoulder Exercises: ROM/Strengthening   Wall Wash 1'                  OT Education - 06/17/20 1552    Education Details AA/ROM shoulder standing    Person(s) Educated Patient    Methods Explanation;Demonstration;Handout;Verbal cues    Comprehension Verbalized understanding;Returned demonstration            OT Short Term Goals - 06/03/20 1352      OT SHORT TERM GOAL #1   Title Paitent will be educated and independent with HEP for improved RUE mobility and strength.    Time 4    Period Weeks    Status On-going    Target Date 06/26/20      OT SHORT TERM GOAL #2   Title Patient will improve right shoulder P/ROM to [facility] for improved independence with dressing and bathing.    Time 4    Period Weeks    Status On-going      OT SHORT TERM GOAL #3   Title Patient will improve right shoulder strength to 3+/5 or better for improved ability to lift grocery  bags.    Time 4    Period Weeks    Status On-going      OT SHORT TERM GOAL #4   Title Patient will decrease pain in her right shoulder to 5/10 or better when sleeping.    Time 4    Period Weeks    Status On-going             OT Long Term Goals - 06/03/20 1352      OT LONG TERM GOAL #1   Title Patient will return to PLOF with all B/IADLs using right upper extremity as dominant.    Time 8    Period Weeks    Status On-going      OT LONG TERM GOAL #2   Title Patient will improve right shoulder A/ROM to [facility] for improved ability to reach overhead and behind her back during ADL tasks.    Time 8    Period Weeks    Status On-going      OT LONG TERM GOAL #3   Title Patient will improve right shoulder strength to 4+/5 or better for improved abiilty to lift laundry baskets.    Time 8    Period Weeks    Status On-going      OT LONG TERM GOAL #4   Title Patient will decrease fascial restrictions to minimal or less in her right shoulder region.    Time 8    Period Weeks    Status On-going      OT LONG TERM GOAL #5   Title Patient will have 3/10 pain or less in her right shoulder region while completing ADL tasks.    Time 8    Period Weeks    Status On-going                 Plan - 06/17/20 1843    Clinical Impression Statement A: Pt requesting to decrease OT to once a week due to financial reasons. Progressed to AA/ROM supine and standing while updated HEP. Patient demonstrates functional passive and active ROM. Required VC for form and technique as well as to not push UE into higher pain level. Manual techniques not completed this date due to no pain upon arrival.    Body Structure /  Function / Physical Skills ADL;Decreased knowledge of use of DME;Strength;Pain;UE functional use;IADL;ROM;Fascial restriction;Muscle spasms;Decreased knowledge of precautions;Skin integrity    Plan P: Follow up on HEP. Continue with AA/ROM. Add pvc pipe slide and abduction in pulleys.    OT  Home Exercise Plan eval:table slides and elbow- wrist A/ROM 4/6: AA/ROM shoulder standing.    Consulted and Agree with Plan of Care Patient           Patient will benefit from skilled therapeutic intervention in order to improve the following deficits and impairments:   Body Structure / Function / Physical Skills: ADL,Decreased knowledge of use of DME,Strength,Pain,UE functional use,IADL,ROM,Fascial restriction,Muscle spasms,Decreased knowledge of precautions,Skin integrity       Visit Diagnosis: Other symptoms and signs involving the musculoskeletal system  Stiffness of right shoulder, not elsewhere classified  Acute pain of right shoulder    Problem List Patient Active Problem List   Diagnosis Date Noted  . Bilateral low back pain with sciatica 08/24/2015    Limmie Patricia, OTR/L,CBIS  [phone]  06/17/2020, 6:48 PM  [facility] [address] [place], [place], [address] Phone: [phone]   Fax:  [phone]  Name: ONESHA KREBBS MRN: [id number] Date of Birth: June 05, 1960

## 2020-06-23 ENCOUNTER — Encounter (HOSPITAL_COMMUNITY): Payer: Medicare Other | Admitting: Occupational Therapy

## 2020-06-26 ENCOUNTER — Other Ambulatory Visit: Payer: Self-pay

## 2020-06-26 ENCOUNTER — Ambulatory Visit (HOSPITAL_COMMUNITY): Payer: Medicare Other | Admitting: Occupational Therapy

## 2020-06-26 DIAGNOSIS — R29898 Other symptoms and signs involving the musculoskeletal system: Secondary | ICD-10-CM

## 2020-06-26 DIAGNOSIS — M25511 Pain in right shoulder: Secondary | ICD-10-CM | POA: Diagnosis not present

## 2020-06-26 DIAGNOSIS — M25611 Stiffness of right shoulder, not elsewhere classified: Secondary | ICD-10-CM | POA: Diagnosis not present

## 2020-06-26 NOTE — Therapy (Signed)
[place], [place], [address] Phone: [phone]   Fax:  [phone]  Occupational Therapy Treatment  Patient Details  Name: Lorraine Horton MRN: [id number] Date of Birth: 1960/11/09 Referring Provider (OT): Dr. Ophelia Charter   Encounter Date: 06/26/2020   OT End of Session - 06/26/20 1512    Visit Number 6    Number of Visits 16    Date for OT Re-Evaluation 07/24/20    Authorization Type [employer] Medicare    Progress Note Due on Visit 10    OT Start Time 1432    OT Stop Time 1512    OT Time Calculation (min) 40 min    Activity Tolerance Patient tolerated treatment well    Behavior During Therapy [facility] for tasks assessed/performed           Past Medical History:  Diagnosis Date  . AC (acromioclavicular) joint bone spurs   . Anxiety   . Arthritis    chronic back, shoulder, knees  . Bursitis   . Depression   . Diabetes mellitus without complication ([facility])   . Hypertension    no meds now    Past Surgical History:  Procedure Laterality Date  . CHOLECYSTECTOMY  1996  . KNEE SURGERY Bilateral   . REVERSE SHOULDER ARTHROPLASTY Right 05/13/2020   Procedure: RIGHT REVERSE TOTAL SHOULDER ARTHROPLASTY;  Surgeon: Hiram Gash, MD;  Location: [place];  Service: Orthopedics;  Laterality: Right;  . TUBAL LIGATION  1992    There were no vitals filed for this visit.   Subjective Assessment - 06/26/20 1434    Subjective  S: Last week I was out of commission for 2 days because it hurt so bad.    Currently in Pain? 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      Precautions   Precautions Shoulder    Type of Shoulder Precautions (located in media tab) Please see scanned protcol for specific guidelines **avoid IR/backwards extension for 3 months      Observation/Other Assessments   Focus on Therapeutic Outcomes (FOTO)  57/100      ROM / Strength   AROM / PROM / Strength AROM;PROM;Strength      AROM   Overall AROM Comments Assessed seated, er/IR adducted    AROM Assessment Site Shoulder    Right/Left Shoulder Right    Right Shoulder Flexion 135 Degrees   not previously assessed   Right Shoulder ABduction 120 Degrees   not previously assessed   Right Shoulder Internal Rotation 90 Degrees   not previously assessed   Right Shoulder External Rotation 40 Degrees   not previously assessed     PROM   Overall PROM Comments assessed in supine, external  rotation with shoulder adducted    PROM Assessment Site Shoulder    Right/Left Shoulder Right    Right Shoulder Flexion 145 Degrees   140 previous   Right Shoulder ABduction 162 Degrees   115 previous   Right Shoulder Internal Rotation 90 Degrees   90 previous   Right Shoulder External Rotation 55 Degrees   40 previous     Strength   Overall Strength Comments Assessed seated, er/IR adducted    Strength Assessment Site Shoulder    Right/Left Shoulder Right    Right Shoulder Flexion 5/5   not previously assessed   Right Shoulder ABduction 5/5   not previously assessed   Right Shoulder Internal Rotation 5/5   not previously assessed   Right Shoulder External Rotation 3/5   not previously assessed                   OT Treatments/Exercises (OP) - 06/26/20 1435      Exercises   Exercises Shoulder      Shoulder Exercises: Supine   Protraction PROM;5 reps;AAROM    Horizontal ABduction PROM;5  reps;AAROM;12 reps    External Rotation PROM;5 reps;AAROM;12 reps    Internal Rotation PROM;5 reps;AAROM;12 reps    Flexion PROM;5 reps;AAROM;12 reps    ABduction PROM;5 reps;AAROM;12 reps      Shoulder Exercises: Standing   Protraction AAROM;10 reps    Horizontal ABduction AAROM;10 reps    External Rotation AAROM;10 reps    Internal Rotation AAROM;10 reps    Flexion AAROM;10 reps    ABduction AAROM;10 reps      Shoulder Exercises: ROM/Strengthening   Wall Wash 1'      Manual Therapy   Manual Therapy Myofascial release    Manual therapy comments manual therapy completed seperately than all other interventions    Myofascial Release manual therapy to right arm, shoulder, scapular region to decrease fascial restrictions and pain and improve joint ROM                    OT Short Term Goals - 06/26/20 1508      OT SHORT TERM GOAL #1   Title Paitent will be educated and independent with HEP for improved RUE mobility and strength.    Time 4    Period Weeks    Status On-going    Target Date 06/26/20      OT SHORT TERM GOAL #2   Title Patient will improve right shoulder P/ROM to [facility] for improved independence with dressing and bathing.    Time 4    Period Weeks    Status Partially Met      OT SHORT TERM GOAL #3   Title Patient will improve right shoulder strength to 3+/5 or better for improved ability to lift grocery bags.    Time 4    Period Weeks    Status Partially Met      OT SHORT TERM GOAL #4   Title Patient will decrease pain in her right shoulder to 5/10 or better when sleeping.    Time 4    Period Weeks    Status Achieved             OT Long Term Goals - 06/26/20 1508      OT LONG TERM GOAL #1   Title Patient will return to PLOF with all B/IADLs using right upper extremity as dominant.    Time 8    Period Weeks    Status On-going  OT LONG TERM GOAL #2   Title Patient will improve right shoulder A/ROM to [facility] for improved ability to reach  overhead and behind her back during ADL tasks.    Time 8    Period Weeks    Status On-going      OT LONG TERM GOAL #3   Title Patient will improve right shoulder strength to 4+/5 or better for improved abiilty to lift laundry baskets.    Time 8    Period Weeks    Status Partially Met      OT LONG TERM GOAL #4   Title Patient will decrease fascial restrictions to minimal or less in her right shoulder region.    Time 8    Period Weeks    Status On-going      OT LONG TERM GOAL #5   Title Patient will have 3/10 pain or less in her right shoulder region while completing ADL tasks.    Time 8    Period Weeks    Status On-going                 Plan - 06/26/20 1513    Clinical Impression Statement A: Mini-reassessment completed this session, pt has met 1 STG and partially met 2 STGs and 1 LTG. Pt reports she has pain at all times but does not let it stop her from completing ADLs. Pt reports she does not complete her HEP, she lets her housework be her exercise. OT educating on difference in RUE mobility with HEP and with housekeeping and encouraged to complete HEP. Continued with AA/ROM, passive stretching and myofascial release today. Verbal cuing for form and technique.    Body Structure / Function / Physical Skills ADL;Decreased knowledge of use of DME;Strength;Pain;UE functional use;IADL;ROM;Fascial restriction;Muscle spasms;Decreased knowledge of precautions;Skin integrity    Plan P: Progress to A/ROM    OT Home Exercise Plan eval:table slides and elbow- wrist A/ROM 4/6: AA/ROM shoulder standing.    Consulted and Agree with Plan of Care Patient           Patient will benefit from skilled therapeutic intervention in order to improve the following deficits and impairments:   Body Structure / Function / Physical Skills: ADL,Decreased knowledge of use of DME,Strength,Pain,UE functional use,IADL,ROM,Fascial restriction,Muscle spasms,Decreased knowledge of precautions,Skin  integrity       Visit Diagnosis: Other symptoms and signs involving the musculoskeletal system  Stiffness of right shoulder, not elsewhere classified  Acute pain of right shoulder    Problem List Patient Active Problem List   Diagnosis Date Noted  . Bilateral low back pain with sciatica 08/24/2015   Guadelupe Sabin, OTR/L  [phone] 06/26/2020, 3:15 PM  [place], [place], [address] Phone: [phone]   Fax:  [phone]  Name: Lorraine Horton MRN: [id number] Date of Birth: 1960-05-12

## 2020-06-30 ENCOUNTER — Encounter (HOSPITAL_COMMUNITY): Payer: Medicare Other | Admitting: Occupational Therapy

## 2020-07-03 ENCOUNTER — Telehealth (HOSPITAL_COMMUNITY): Payer: Self-pay | Admitting: Occupational Therapy

## 2020-07-03 ENCOUNTER — Ambulatory Visit (HOSPITAL_COMMUNITY): Payer: Medicare Other | Admitting: Occupational Therapy

## 2020-07-03 NOTE — Telephone Encounter (Signed)
pt cancelled appt for today because she was doing too much and messed her arm up

## 2020-07-06 ENCOUNTER — Encounter (HOSPITAL_COMMUNITY): Payer: Medicare Other

## 2020-07-08 ENCOUNTER — Ambulatory Visit (HOSPITAL_COMMUNITY): Payer: Medicare Other

## 2020-07-08 ENCOUNTER — Encounter (HOSPITAL_COMMUNITY): Payer: Self-pay

## 2020-07-08 ENCOUNTER — Other Ambulatory Visit: Payer: Self-pay

## 2020-07-08 DIAGNOSIS — M25511 Pain in right shoulder: Secondary | ICD-10-CM

## 2020-07-08 DIAGNOSIS — R29898 Other symptoms and signs involving the musculoskeletal system: Secondary | ICD-10-CM

## 2020-07-08 DIAGNOSIS — M25611 Stiffness of right shoulder, not elsewhere classified: Secondary | ICD-10-CM | POA: Diagnosis not present

## 2020-07-08 NOTE — Patient Instructions (Addendum)
  Complete the following exercises 2-3 times a day.  EXTERNAL ROTATION STRETCH  Place arm along edge of door as pictured.  Keep elbow and shoulder at 90 degree angles.  Gently lean the upper torso forward until a stretch is felt in the shoulder. Hold 10-20___ seconds; relax; repeat   Wall Flexion  Slide your arm up the wall or door frame until a stretch is felt in your shoulder . Hold for 20-30 seconds. Complete 2 times     Shoulder Abduction Stretch  Stand side ways by a wall with affected up on wall. Gently step in toward wall to feel stretch. Hold for 20-30 seconds. Complete 2 times.     Repeat all exercises 10-15 times, 1-2 times per day.  1) Shoulder Protraction    Begin with elbows by your side, slowly "punch" straight out in front of you.      2) Shoulder Flexion   Standing:         Begin with arms at your side with thumbs pointed up, slowly raise both arms up and forward towards overhead.        3) Horizontal abduction/adduction   Standing:           Begin with arms straight out in front of you, bring out to the side in at "T" shape. Keep arms straight entire time.         4) Internal & External Rotation Standing:     Stand with elbows at the side and elbows bent 90 degrees. Move your forearms away from your body, then bring back inward toward the body.     5) Shoulder Abduction   Standing:       Begin with your arms flat next to your side. Slowly move your arms out to the side so that they go overhead, in a jumping jack or snow angel movement.

## 2020-07-09 NOTE — Therapy (Signed)
[place], [place], [address] Phone: [phone]   Fax:  [phone]  Occupational Therapy Treatment  Patient Details  Name: Lorraine Horton MRN: [id number] Date of Birth: [birth year] Referring Provider (OT): Dr. Ophelia Charter   Encounter Date: 07/08/2020   OT End of Session - 07/09/20 1318    Visit Number 7    Number of Visits 16    Date for OT Re-Evaluation 07/24/20    Authorization Type [employer] Medicare    Progress Note Due on Visit 10    OT Start Time 1645    OT Stop Time 1727    OT Time Calculation (min) 42 min    Activity Tolerance Patient tolerated treatment well    Behavior During Therapy [facility] for tasks assessed/performed           Past Medical History:  Diagnosis Date  . AC (acromioclavicular) joint bone spurs   . Anxiety   . Arthritis    chronic back, shoulder, knees  . Bursitis   . Depression   . Diabetes mellitus without complication ([place])   . Hypertension    no meds now    Past Surgical History:  Procedure Laterality Date  . CHOLECYSTECTOMY  1996  . KNEE SURGERY Bilateral   . REVERSE SHOULDER ARTHROPLASTY Right 05/13/2020   Procedure: RIGHT REVERSE TOTAL SHOULDER ARTHROPLASTY;  Surgeon: Hiram Gash, MD;  Location: [place];  Service: Orthopedics;  Laterality: Right;  . TUBAL LIGATION  1992    There were no vitals filed for this visit.   Subjective Assessment - 07/08/20 1701    Subjective  S: I  was hanging up screens overhead and I over did it.    Currently in Pain? 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      Precautions   Precautions Shoulder    Type of Shoulder Precautions (located in media tab) Please see scanned protcol for specific guidelines **avoid IR/backwards extension for 3 months                    OT  Treatments/Exercises (OP) - 07/08/20 1704      Exercises   Exercises Shoulder      Shoulder Exercises: Supine   Protraction PROM;5 reps    Horizontal ABduction PROM;5 reps    External Rotation PROM;5 reps    Internal Rotation PROM;5 reps    Flexion PROM;5 reps    ABduction PROM;5 reps      Shoulder Exercises: Standing   Protraction AROM;10 reps    Horizontal ABduction AROM;10 reps    External Rotation AROM;10 reps    Internal Rotation AROM;10 reps    Flexion AROM;10 reps    ABduction AROM;10 reps      Shoulder Exercises: Pulleys   Flexion 1 minute   standing     Shoulder Exercises: ROM/Strengthening   Wall Wash 1'      Shoulder Exercises: Stretch   External Rotation Stretch 1 rep;10 seconds;20 seconds   using doorframe   Wall Stretch - Flexion 2 reps;30 seconds    Wall Stretch - ABduction 2 reps;30 seconds    Other Shoulder Stretches Doorway stretch; 2X30"  OT Short Term Goals - 07/09/20 1328      OT SHORT TERM GOAL #1   Title Paitent will be educated and independent with HEP for improved RUE mobility and strength.    Time 4    Period Weeks    Status On-going    Target Date 06/26/20      OT SHORT TERM GOAL #2   Title Patient will improve right shoulder P/ROM to [facility] for improved independence with dressing and bathing.    Time 4    Period Weeks    Status Partially Met      OT SHORT TERM GOAL #3   Title Patient will improve right shoulder strength to 3+/5 or better for improved ability to lift grocery bags.    Time 4    Period Weeks    Status Partially Met      OT SHORT TERM GOAL #4   Title Patient will decrease pain in her right shoulder to 5/10 or better when sleeping.    Time 4    Period Weeks             OT Long Term Goals - 06/26/20 1508      OT LONG TERM GOAL #1   Title Patient will return to PLOF with all B/IADLs using right upper extremity as dominant.    Time 8    Period Weeks    Status On-going      OT LONG  TERM GOAL #2   Title Patient will improve right shoulder A/ROM to [facility] for improved ability to reach overhead and behind her back during ADL tasks.    Time 8    Period Weeks    Status On-going      OT LONG TERM GOAL #3   Title Patient will improve right shoulder strength to 4+/5 or better for improved abiilty to lift laundry baskets.    Time 8    Period Weeks    Status Partially Met      OT LONG TERM GOAL #4   Title Patient will decrease fascial restrictions to minimal or less in her right shoulder region.    Time 8    Period Weeks    Status On-going      OT LONG TERM GOAL #5   Title Patient will have 3/10 pain or less in her right shoulder region while completing ADL tasks.    Time 8    Period Weeks    Status On-going                 Plan - 07/09/20 1320    Clinical Impression Statement A: Pt reports that she is now doing her HEP at home since last session. She reports that she is most concerned for her ROM; primarily external rotation with shoulder abducted. No manual techniques were needed this session. Education provided throughout session on overuse of arm and the risks. Updated HEP to A/ROM and shoulder stretches. VC were needed for form and technique and to refrain from causing increased pain when stretching and only stretching in a tolerable range.    Body Structure / Function / Physical Skills ADL;Decreased knowledge of use of DME;Strength;Pain;UE functional use;IADL;ROM;Fascial restriction;Muscle spasms;Decreased knowledge of precautions;Skin integrity    Plan P: Continue to focus on A/ROM.    OT Home Exercise Plan eval:table slides and elbow- wrist A/ROM 4/6: AA/ROM shoulder standing. 4/27: A/ROM shoulder; shoulder flexion, abduction, and er stretch    Consulted and Agree with Plan of Care  Patient           Patient will benefit from skilled therapeutic intervention in order to improve the following deficits and impairments:   Body Structure / Function / Physical  Skills: ADL,Decreased knowledge of use of DME,Strength,Pain,UE functional use,IADL,ROM,Fascial restriction,Muscle spasms,Decreased knowledge of precautions,Skin integrity       Visit Diagnosis: Stiffness of right shoulder, not elsewhere classified  Acute pain of right shoulder  Other symptoms and signs involving the musculoskeletal system    Problem List Patient Active Problem List   Diagnosis Date Noted  . Bilateral low back pain with sciatica 08/24/2015    Ailene Ravel, OTR/L,CBIS  [phone]  07/09/2020, 1:29 PM  [place] [address] [place], [place], [address] Phone: [phone]   Fax:  [phone]  Name: Lorraine Horton MRN: [id number] Date of Birth: 05-22-1960

## 2020-07-13 ENCOUNTER — Encounter (HOSPITAL_COMMUNITY): Payer: Medicare Other

## 2020-07-15 ENCOUNTER — Other Ambulatory Visit: Payer: Self-pay

## 2020-07-15 ENCOUNTER — Encounter (HOSPITAL_COMMUNITY): Payer: Self-pay | Admitting: Occupational Therapy

## 2020-07-15 ENCOUNTER — Ambulatory Visit (HOSPITAL_COMMUNITY): Payer: Medicare Other | Attending: Orthopaedic Surgery | Admitting: Occupational Therapy

## 2020-07-15 DIAGNOSIS — R29898 Other symptoms and signs involving the musculoskeletal system: Secondary | ICD-10-CM | POA: Diagnosis not present

## 2020-07-15 DIAGNOSIS — M25511 Pain in right shoulder: Secondary | ICD-10-CM | POA: Insufficient documentation

## 2020-07-15 DIAGNOSIS — M25611 Stiffness of right shoulder, not elsewhere classified: Secondary | ICD-10-CM | POA: Diagnosis not present

## 2020-07-15 NOTE — Therapy (Signed)
[place], [place], [address] Phone: [phone]   Fax:  [phone]  Occupational Therapy Reassessment, Treatment, Discharge Summary  Patient Details  Name: Lorraine Horton MRN: [id number] Date of Birth: 1960/10/11 Referring Provider (OT): Dr. Ophelia Charter   Encounter Date: 07/15/2020   OT End of Session - 07/15/20 1711    Visit Number 8    Number of Visits 16    Date for OT Re-Evaluation 07/24/20    Authorization Type [employer] Medicare    Progress Note Due on Visit 10    OT Start Time 1646    OT Stop Time 1710    OT Time Calculation (min) 24 min    Activity Tolerance Patient tolerated treatment well    Behavior During Therapy [facility] for tasks assessed/performed           Past Medical History:  Diagnosis Date  . AC (acromioclavicular) joint bone spurs   . Anxiety   . Arthritis    chronic back, shoulder, knees  . Bursitis   . Depression   . Diabetes mellitus without complication ([place])   . Hypertension    no meds now    Past Surgical History:  Procedure Laterality Date  . CHOLECYSTECTOMY  1996  . KNEE SURGERY Bilateral   . REVERSE SHOULDER ARTHROPLASTY Right 05/13/2020   Procedure: RIGHT REVERSE TOTAL SHOULDER ARTHROPLASTY;  Surgeon: Hiram Gash, MD;  Location: [place] [facility];  Service: Orthopedics;  Laterality: Right;  . TUBAL LIGATION  1992    There were no vitals filed for this visit.   Subjective Assessment - 07/15/20 1645    Subjective  S: Let's measure today.    Currently in Pain? No/denies              [facility] OT Assessment - 07/15/20 1645      Assessment   Medical Diagnosis S/P Right Reverse Total Shoulder Replacement with Latissimus Dorsi Transfer      Precautions   Precautions Shoulder    Type of Shoulder Precautions (located in media tab) Please see scanned protcol for specific guidelines **avoid IR/backwards extension for 3 months      Observation/Other Assessments    Focus on Therapeutic Outcomes (FOTO)  63/100   57/100 previous     AROM   Overall AROM Comments Assessed seated, er/IR adducted    AROM Assessment Site Shoulder    Right/Left Shoulder Right    Right Shoulder Flexion 140 Degrees   135 previous (LUE baseline is 150)   Right Shoulder ABduction 140 Degrees   120 previous   Right Shoulder Internal Rotation 90 Degrees   same as previous   Right Shoulder External Rotation 41 Degrees   40 previous     PROM   Overall PROM Comments assessed in supine, external rotation with shoulder adducted    PROM Assessment Site Shoulder    Right/Left Shoulder Right    Right Shoulder Flexion 145 Degrees   same as previous   Right Shoulder ABduction 162 Degrees   same as previous   Right Shoulder Internal Rotation 90 Degrees   same as previous   Right Shoulder External Rotation 60 Degrees   55 previous     Strength   Overall Strength Comments Assessed seated, er/IR adducted    Strength Assessment Site Shoulder    Right/Left Shoulder Right    Right Shoulder Flexion 5/5   same as previous   Right Shoulder ABduction 5/5   same  as previous   Right Shoulder Internal Rotation 5/5   same as previous   Right Shoulder External Rotation 4/5   3/5 previous                   OT Treatments/Exercises (OP) - 07/15/20 1649      Exercises   Exercises Shoulder      Shoulder Exercises: Standing   Protraction AROM;10 reps    Horizontal ABduction AROM;10 reps    External Rotation AROM;10 reps    Internal Rotation AROM;10 reps    Flexion AROM;10 reps    ABduction AROM;10 reps                  OT Education - 07/15/20 1700    Education Details A/ROM standing    Person(s) Educated Patient    Methods Explanation;Demonstration;Handout;Verbal cues    Comprehension Verbalized understanding;Returned demonstration            OT Short Term Goals - 07/15/20 1657      OT SHORT TERM GOAL #1   Title Paitent will be educated and independent with HEP  for improved RUE mobility and strength.    Time 4    Period Weeks    Status Achieved    Target Date 06/26/20      OT SHORT TERM GOAL #2   Title Patient will improve right shoulder P/ROM to [facility] for improved independence with dressing and bathing.    Time 4    Period Weeks    Status Achieved      OT SHORT TERM GOAL #3   Title Patient will improve right shoulder strength to 3+/5 or better for improved ability to lift grocery bags.    Time 4    Period Weeks    Status Achieved      OT SHORT TERM GOAL #4   Title Patient will decrease pain in her right shoulder to 5/10 or better when sleeping.    Time 4    Period Weeks             OT Long Term Goals - 07/15/20 1658      OT LONG TERM GOAL #1   Title Patient will return to PLOF with all B/IADLs using right upper extremity as dominant.    Time 8    Period Weeks    Status Partially Met      OT LONG TERM GOAL #2   Title Patient will improve right shoulder A/ROM to [facility] for improved ability to reach overhead and behind her back during ADL tasks.    Time 8    Period Weeks    Status Achieved      OT LONG TERM GOAL #3   Title Patient will improve right shoulder strength to 4+/5 or better for improved abiilty to lift laundry baskets.    Time 8    Period Weeks    Status Partially Met      OT LONG TERM GOAL #4   Title Patient will decrease fascial restrictions to minimal or less in her right shoulder region.    Time 8    Period Weeks    Status Achieved      OT LONG TERM GOAL #5   Title Patient will have 3/10 pain or less in her right shoulder region while completing ADL tasks.    Time 8    Period Weeks    Status Achieved  Plan - 07/15/20 1703    Clinical Impression Statement A: Reassessment completed today at pt's request due to good functioning and high gas prices. Pt has made good progress and has met all STGs and 3/5 LTGs with remaining LTGs partially met. Pt is using her RUE for ADLs, housekeeping,  and projects without difficulty. Pt reports the only issue she has is lifting items heavier than a gallon of milk. Educated pt on precautions still in effect and on updated HEP. Pt verbalized understanding and is agreeable to discharge today.    Body Structure / Function / Physical Skills ADL;Decreased knowledge of use of DME;Strength;Pain;UE functional use;IADL;ROM;Fascial restriction;Muscle spasms;Decreased knowledge of precautions;Skin integrity    Plan P: Discharge pt    OT Home Exercise Plan eval:table slides and elbow- wrist A/ROM 4/6: AA/ROM shoulder standing. 4/27: A/ROM shoulder; shoulder flexion, abduction, and er stretch    Consulted and Agree with Plan of Care Patient           Patient will benefit from skilled therapeutic intervention in order to improve the following deficits and impairments:   Body Structure / Function / Physical Skills: ADL,Decreased knowledge of use of DME,Strength,Pain,UE functional use,IADL,ROM,Fascial restriction,Muscle spasms,Decreased knowledge of precautions,Skin integrity       Visit Diagnosis: Stiffness of right shoulder, not elsewhere classified  Acute pain of right shoulder  Other symptoms and signs involving the musculoskeletal system    Problem List Patient Active Problem List   Diagnosis Date Noted  . Bilateral low back pain with sciatica 08/24/2015   Guadelupe Sabin, OTR/L  [phone] 07/15/2020, 5:12 PM  [facility] [address] [place], [place], [address] Phone: [phone]   Fax:  [phone]  Name: Lorraine Horton MRN: [id number] Date of Birth: 1960-07-13   OCCUPATIONAL THERAPY DISCHARGE SUMMARY  Visits from Start of Care: 8  Current functional level related to goals / functional outcomes: See above. Pt has met all STGs, 3/5 LTGs and partially met remaining LTGs. Pt is using RUE during all ADLs and housekeeping, projects, outdoor work, with minimal difficulty. Of note-her ROM  is near her baseline compared to LUE for flexion and abduction.    Remaining deficits: Decreased ROM, decreased strength and activity tolerance   Education / Equipment: HEP for A/ROM, education on continued precautions Plan: Patient agrees to discharge.  Patient goals were met. Patient is being discharged due to being pleased with the current functional level.  ?????

## 2020-07-15 NOTE — Patient Instructions (Signed)

## 2020-07-20 ENCOUNTER — Encounter (HOSPITAL_COMMUNITY): Payer: Medicare Other

## 2020-07-22 ENCOUNTER — Ambulatory Visit (HOSPITAL_COMMUNITY): Payer: Medicare Other | Admitting: Specialist

## 2020-08-04 DIAGNOSIS — M25561 Pain in right knee: Secondary | ICD-10-CM | POA: Diagnosis not present

## 2020-08-04 DIAGNOSIS — M25562 Pain in left knee: Secondary | ICD-10-CM | POA: Diagnosis not present

## 2020-08-04 DIAGNOSIS — M19011 Primary osteoarthritis, right shoulder: Secondary | ICD-10-CM | POA: Diagnosis not present

## 2020-08-06 DIAGNOSIS — G8929 Other chronic pain: Secondary | ICD-10-CM | POA: Diagnosis not present

## 2020-08-06 DIAGNOSIS — I10 Essential (primary) hypertension: Secondary | ICD-10-CM | POA: Diagnosis not present

## 2020-08-06 DIAGNOSIS — Z1389 Encounter for screening for other disorder: Secondary | ICD-10-CM | POA: Diagnosis not present

## 2020-08-06 DIAGNOSIS — E7849 Other hyperlipidemia: Secondary | ICD-10-CM | POA: Diagnosis not present

## 2020-08-06 DIAGNOSIS — E119 Type 2 diabetes mellitus without complications: Secondary | ICD-10-CM | POA: Diagnosis not present

## 2020-08-06 DIAGNOSIS — Z0000 Encounter for general adult medical examination without abnormal findings: Secondary | ICD-10-CM | POA: Diagnosis not present

## 2020-08-13 DIAGNOSIS — M170 Bilateral primary osteoarthritis of knee: Secondary | ICD-10-CM | POA: Diagnosis not present

## 2020-08-20 DIAGNOSIS — M170 Bilateral primary osteoarthritis of knee: Secondary | ICD-10-CM | POA: Diagnosis not present

## 2020-08-27 DIAGNOSIS — M170 Bilateral primary osteoarthritis of knee: Secondary | ICD-10-CM | POA: Diagnosis not present

## 2020-09-09 ENCOUNTER — Other Ambulatory Visit: Payer: Self-pay | Admitting: Family Medicine

## 2020-09-09 DIAGNOSIS — Z139 Encounter for screening, unspecified: Secondary | ICD-10-CM

## 2020-11-03 ENCOUNTER — Other Ambulatory Visit: Payer: Self-pay | Admitting: Orthopaedic Surgery

## 2020-11-03 DIAGNOSIS — M542 Cervicalgia: Secondary | ICD-10-CM | POA: Diagnosis not present

## 2020-11-03 DIAGNOSIS — M25511 Pain in right shoulder: Secondary | ICD-10-CM

## 2020-11-23 ENCOUNTER — Ambulatory Visit
Admission: RE | Admit: 2020-11-23 | Discharge: 2020-11-23 | Disposition: A | Discharge status: Home / Self Care | Payer: Medicare Other | Source: Ambulatory Visit | Attending: Orthopaedic Surgery | Admitting: Orthopaedic Surgery

## 2020-11-23 ENCOUNTER — Other Ambulatory Visit: Payer: Self-pay

## 2020-11-23 DIAGNOSIS — Z96611 Presence of right artificial shoulder joint: Secondary | ICD-10-CM | POA: Diagnosis not present

## 2020-11-23 DIAGNOSIS — M25511 Pain in right shoulder: Secondary | ICD-10-CM

## 2020-11-23 DIAGNOSIS — Z471 Aftercare following joint replacement surgery: Secondary | ICD-10-CM | POA: Diagnosis not present

## 2020-12-08 DIAGNOSIS — M25511 Pain in right shoulder: Secondary | ICD-10-CM | POA: Diagnosis not present

## 2020-12-08 DIAGNOSIS — M542 Cervicalgia: Secondary | ICD-10-CM | POA: Diagnosis not present

## 2020-12-24 DIAGNOSIS — M542 Cervicalgia: Secondary | ICD-10-CM | POA: Diagnosis not present

## 2021-01-11 ENCOUNTER — Ambulatory Visit
Admission: RE | Admit: 2021-01-11 | Discharge: 2021-01-11 | Disposition: A | Discharge status: Home / Self Care | Payer: Medicare Other | Source: Ambulatory Visit | Attending: Family Medicine | Admitting: Family Medicine

## 2021-01-11 ENCOUNTER — Other Ambulatory Visit: Payer: Self-pay

## 2021-01-11 DIAGNOSIS — Z139 Encounter for screening, unspecified: Secondary | ICD-10-CM

## 2021-01-11 DIAGNOSIS — Z1231 Encounter for screening mammogram for malignant neoplasm of breast: Secondary | ICD-10-CM | POA: Diagnosis not present

## 2021-03-12 ENCOUNTER — Other Ambulatory Visit: Payer: Self-pay

## 2021-03-12 ENCOUNTER — Encounter (HOSPITAL_COMMUNITY): Payer: Self-pay

## 2021-03-12 ENCOUNTER — Emergency Department (HOSPITAL_COMMUNITY)
Admission: EM | Admit: 2021-03-12 | Discharge: 2021-03-12 | Disposition: A | Discharge status: Home / Self Care | Payer: Medicare Other | Attending: Emergency Medicine | Admitting: Emergency Medicine

## 2021-03-12 DIAGNOSIS — U071 COVID-19: Secondary | ICD-10-CM | POA: Insufficient documentation

## 2021-03-12 DIAGNOSIS — R067 Sneezing: Secondary | ICD-10-CM | POA: Diagnosis present

## 2021-03-12 DIAGNOSIS — Z20822 Contact with and (suspected) exposure to covid-19: Secondary | ICD-10-CM

## 2021-03-12 DIAGNOSIS — E119 Type 2 diabetes mellitus without complications: Secondary | ICD-10-CM | POA: Diagnosis not present

## 2021-03-12 DIAGNOSIS — I10 Essential (primary) hypertension: Secondary | ICD-10-CM | POA: Insufficient documentation

## 2021-03-12 LAB — RESP PANEL BY RT-PCR (FLU A&B, COVID) ARPGX2
Influenza A by PCR: NEGATIVE
Influenza B by PCR: NEGATIVE
SARS Coronavirus 2 by RT PCR: POSITIVE — AB

## 2021-03-12 MED ORDER — MOLNUPIRAVIR EUA 200MG CAPSULE: 4.0000 | ORAL_CAPSULE | Freq: Two times a day (BID) | ORAL | 0 refills | Status: AC

## 2021-03-12 NOTE — ED Triage Notes (Signed)
Pt presents to ED from home, says her husband and daughter made her come here to be tested for covid, pt took home test that was negative, but didn't know if she did it right. Pt reports sneezing and headache as symptoms. Pt has hx of migraines.

## 2021-03-12 NOTE — Discharge Instructions (Signed)
Follow-up with the test results on MyChart  May start taking the antiviral medication if your test is positive  Follow-up with primary care provider

## 2021-03-12 NOTE — ED Provider Notes (Signed)
[facility] EMERGENCY DEPARTMENT Provider Note   CSN: [id number] Arrival date & time: 03/12/21  1932     History Chief Complaint  Patient presents with   wants to be tested for covid    LORAE ROIG is a 60 y.o. female with past medical history significant for hypertension, diabetes, hyperlipidemia who presents for evaluation of requesting COVID test.  Patient states she took a home test which was negative.  Has had some sneezing and rhinorrhea.  No fever, chest pain, shortness of breath, cough, myalgias, chest pain, shortness of breath or abdominal pain.  Denies additional aggravating or alleviating factors.  Denies additional aggravating or relieving factors.  Of note family also here for similar testing  History obtained from patient and past medical records.  No interpreter used.  HPI     Past Medical History:  Diagnosis Date   AC (acromioclavicular) joint bone spurs    Anxiety    Arthritis    chronic back, shoulder, knees   Bursitis    Depression    Diabetes mellitus without complication ([facility])    Hypertension    no meds now    Patient Active Problem List   Diagnosis Date Noted   Bilateral low back pain with sciatica 08/24/2015    Past Surgical History:  Procedure Laterality Date   CHOLECYSTECTOMY  1996   KNEE SURGERY Bilateral    REVERSE SHOULDER ARTHROPLASTY Right 05/13/2020   Procedure: RIGHT REVERSE TOTAL SHOULDER ARTHROPLASTY;  Surgeon: Bjorn Pippin, MD;  Location: [facility];  Service: Orthopedics;  Laterality: Right;   TUBAL LIGATION  1992     OB History   No obstetric history on file.     Family History  Problem Relation Age of Onset   Stroke Mother    High blood pressure Mother    High blood pressure Father    Diabetes Father    Breast cancer Neg Hx     Social History   Tobacco Use   Smoking status: Never   Smokeless tobacco: Never  Substance Use Topics   Alcohol use: Yes    Comment: occ   Drug use: No    Home  Medications Prior to Admission medications   Medication Sig Start Date End Date Taking? Authorizing Provider  molnupiravir EUA (LAGEVRIO) 200 mg CAPS capsule Take 4 capsules (800 mg total) by mouth 2 (two) times daily for 5 days. 03/12/21 03/17/21 Yes Kinsleigh Ludolph A, PA-C  atorvastatin (LIPITOR) 20 MG tablet Take 20 mg by mouth daily. 04/27/18   [provider]  buPROPion (WELLBUTRIN XL) 150 MG 24 hr tablet Take 450 mg by mouth daily. 05/05/18   [provider]  DULoxetine (CYMBALTA) 60 MG capsule Take 60 mg by mouth daily. 10/25/19   [provider]  Empagliflozin (JARDIANCE PO) Take by mouth.    [provider]  FLUoxetine (PROZAC) 40 MG capsule Take 80 mg by mouth daily. 04/06/18   [provider]  gabapentin (NEURONTIN) 800 MG tablet Take 1 tablet by mouth 4 (four) times daily. 05/09/18   [provider]  omeprazole (PRILOSEC) 20 MG capsule Take 1 capsule (20 mg total) by mouth daily. 30 days for gastroprotection while taking NSAIDs. 05/13/20   McBane, Jerald Kief, PA-C  promethazine (PHENERGAN) 12.5 MG tablet Take 1 tablet (12.5 mg total) by mouth every 6 (six) hours as needed for nausea or vomiting. 05/13/20   McBane, Jerald Kief, PA-C  tiZANidine (ZANAFLEX) 4 MG tablet Take 4 mg by  mouth 3 (three) times daily as needed. 05/16/18   [provider]  topiramate (TOPAMAX) 25 MG tablet  11/24/19   [provider]  traMADol (ULTRAM) 50 MG tablet Take 1 tablet (50 mg total) by mouth every 6 (six) hours as needed for severe pain. 05/13/20   Vernetta Honey, PA-C    Allergies    Bee venom, Codeine, and Darvocet [propoxyphene n-acetaminophen]  Review of Systems   Review of Systems  HENT:  Positive for rhinorrhea and sneezing.   Respiratory: Negative.    Cardiovascular: Negative.   Gastrointestinal: Negative.   Genitourinary: Negative.   Musculoskeletal: Negative.   Skin: Negative.   Neurological: Negative.   All other systems  reviewed and are negative.  Physical Exam Updated Vital Signs BP 123/62 (BP Location: Right Arm)    Pulse 78    Temp 98.6 F (37 C) (Oral)    Resp 15    Ht 5\' 6"  (1.676 m)    Wt 115.7 kg    LMP  (LMP Unknown) Comment: BTL   SpO2 97%    BMI 41.16 kg/m   Physical Exam Vitals and nursing note reviewed.  Constitutional:      General: She is not in acute distress.    Appearance: She is well-developed. She is not ill-appearing, toxic-appearing or diaphoretic.  HENT:     Head: Normocephalic and atraumatic.     Nose: Rhinorrhea present. No congestion.     Mouth/Throat:     Comments: PO clear, no tonsillar exudate, edema Eyes:     Pupils: Pupils are equal, round, and reactive to light.  Cardiovascular:     Rate and Rhythm: Normal rate.  Pulmonary:     Effort: No respiratory distress.  Abdominal:     General: There is no distension.  Musculoskeletal:        General: Normal range of motion.     Cervical back: Normal range of motion.  Skin:    General: Skin is warm and dry.  Neurological:     General: No focal deficit present.     Mental Status: She is alert.  Psychiatric:        Mood and Affect: Mood normal.    ED Results / Procedures / Treatments   Labs (all labs ordered are listed, but only abnormal results are displayed) Labs Reviewed  RESP PANEL BY RT-PCR (FLU A&B, COVID) ARPGX2    EKG None  Radiology No results found.  Procedures Procedures   Medications Ordered in ED Medications - No data to display  ED Course  I have reviewed the triage vital signs and the nursing notes.  Pertinent labs & imaging results that were available during my care of the patient were reviewed by me and considered in my medical decision making (see chart for details).  Pleasant 60 year old here for evaluation of COVID test.  She is afebrile, nonseptic, not ill-appearing.  Has some rhinorrhea, sneezing.  Appears otherwise well.  Neurovascular intact.  Heart, lungs, abdomen exam benign.   Stable vital signs.  COVID testing obtained  She will follow-up with results on MyChart.  On multiple medications had prohibit Paxlovid. Wrote other antiviral incase her testing is positive.  She will FU with PCP or return for new or worsening symptoms  The patient has been appropriately medically screened and/or stabilized in the ED. I have low suspicion for any other emergent medical condition which would require further screening, evaluation or treatment in the ED or require inpatient management.  Patient  is hemodynamically stable and in no acute distress.  Patient able to ambulate in department prior to ED.  Evaluation does not show acute pathology that would require ongoing or additional emergent interventions while in the emergency department or further inpatient treatment.  I have discussed the diagnosis with the patient and answered all questions.  Pain is been managed while in the emergency department and patient has no further complaints prior to discharge.  Patient is comfortable with plan discussed in room and is stable for discharge at this time.  I have discussed strict return precautions for returning to the emergency department.  Patient was encouraged to follow-up with PCP/specialist refer to at discharge.     MDM Rules/Calculators/A&P                             Final Clinical Impression(s) / ED Diagnoses Final diagnoses:  COVID-19 virus test result unknown    Rx / DC Orders ED Discharge Orders          Ordered    molnupiravir EUA (LAGEVRIO) 200 mg CAPS capsule  2 times daily        03/12/21 2118             Danija Gosa A, PA-C 03/12/21 2119    Eber Hong, MD 03/13/21 1109

## 2021-03-29 ENCOUNTER — Other Ambulatory Visit: Payer: Self-pay | Admitting: Neurological Surgery

## 2021-04-05 NOTE — Pre-Procedure Instructions (Signed)
Surgical Instructions    Your procedure is scheduled on Tuesday, January 31st.  Report to [facility] Main Entrance "A" at 5:30 A.M., then check in with the Admitting office.  Call this number if you have problems the morning of surgery:  [phone]   If you have any questions prior to your surgery date call [phone]: Open Monday-Friday 8am-4pm    Remember:  Do not eat or drink after midnight the night before your surgery   Take these medicines the morning of surgery with A SIP OF WATER  atorvastatin (LIPITOR)  buPROPion (WELLBUTRIN XL) DULoxetine (CYMBALTA) FLUoxetine (PROZAC)  tiZANidine (ZANAFLEX)-as needed traMADol (ULTRAM)-as needed  As of today, STOP taking any Aspirin (unless otherwise instructed by your surgeon) Aleve, Naproxen, Ibuprofen, Motrin, Advil, Goody's, BC's, all herbal medications, fish oil, and all vitamins.          WHAT DO I DO ABOUT MY DIABETES MEDICATION?   Do not take empagliflozin (JARDIANCE) on Monday, (1/30) or Tuesday, (1/31).   HOW TO MANAGE YOUR DIABETES BEFORE AND AFTER SURGERY  Why is it important to control my blood sugar before and after surgery? Improving blood sugar levels before and after surgery helps healing and can limit problems. A way of improving blood sugar control is eating a healthy diet by:  Eating less sugar and carbohydrates  Increasing activity/exercise  Talking with your doctor about reaching your blood sugar goals High blood sugars (greater than 180 mg/dL) can raise your risk of infections and slow your recovery, so you will need to focus on controlling your diabetes during the weeks before surgery. Make sure that the doctor who takes care of your diabetes knows about your planned surgery including the date and location.  How do I manage my blood sugar before surgery? Check your blood sugar at least 4 times a day, starting 2 days before surgery, to make sure that the level is not too high or low.  Check your blood  sugar the morning of your surgery when you wake up and every 2 hours until you get to the Short Stay unit.  If your blood sugar is less than 70 mg/dL, you will need to treat for low blood sugar: Do not take insulin. Treat a low blood sugar (less than 70 mg/dL) with  cup of clear juice (cranberry or apple), 4 glucose tablets, OR glucose gel. Recheck blood sugar in 15 minutes after treatment (to make sure it is greater than 70 mg/dL). If your blood sugar is not greater than 70 mg/dL on recheck, call [phone] for further instructions. Report your blood sugar to the short stay nurse when you get to Short Stay.  If you are admitted to the hospital after surgery: Your blood sugar will be checked by the staff and you will probably be given insulin after surgery (instead of oral diabetes medicines) to make sure you have good blood sugar levels. The goal for blood sugar control after surgery is 80-180 mg/dL.             Do NOT Smoke (Tobacco/Vaping) for 24 hours prior to your procedure.  If you use a CPAP at night, you may bring your mask/headgear for your overnight stay.   Contacts, glasses, piercing's, hearing aid's, dentures or partials may not be worn into surgery, please bring cases for these belongings.    For patients admitted to the hospital, discharge time will be determined by your treatment team.   Patients discharged the day of surgery will not be allowed  to drive home, and someone needs to stay with them for 24 hours.  NO VISITORS WILL BE ALLOWED IN PRE-OP WHERE PATIENTS ARE PREPPED FOR SURGERY.  ONLY 1 SUPPORT PERSON MAY BE PRESENT IN THE WAITING ROOM WHILE YOU ARE IN SURGERY.  IF YOU ARE TO BE ADMITTED, ONCE YOU ARE IN YOUR ROOM YOU WILL BE ALLOWED TWO (2) VISITORS. (1) VISITOR MAY STAY OVERNIGHT BUT MUST ARRIVE TO THE ROOM BY 8pm.  Minor children may have two parents present. Special consideration for safety and communication needs will be reviewed on a case by case  basis.   Special instructions:   [facility]- Preparing For Surgery  Before surgery, you can play an important role. Because skin is not sterile, your skin needs to be as free of germs as possible. You can reduce the number of germs on your skin by washing with CHG (chlorahexidine gluconate) Soap before surgery.  CHG is an antiseptic cleaner which kills germs and bonds with the skin to continue killing germs even after washing.    Oral Hygiene is also important to reduce your risk of infection.  Remember - BRUSH YOUR TEETH THE MORNING OF SURGERY WITH YOUR REGULAR TOOTHPASTE  Please do not use if you have an allergy to CHG or antibacterial soaps. If your skin becomes reddened/irritated stop using the CHG.  Do not shave (including legs and underarms) for at least 48 hours prior to first CHG shower. It is OK to shave your face.  Please follow these instructions carefully.   Shower the NIGHT BEFORE SURGERY and the MORNING OF SURGERY  If you chose to wash your hair, wash your hair first as usual with your normal shampoo.  After you shampoo, rinse your hair and body thoroughly to remove the shampoo.  Use CHG Soap as you would any other liquid soap. You can apply CHG directly to the skin and wash gently with a scrungie or a clean washcloth.   Apply the CHG Soap to your body ONLY FROM THE NECK DOWN.  Do not use on open wounds or open sores. Avoid contact with your eyes, ears, mouth and genitals (private parts). Wash Face and genitals (private parts)  with your normal soap.   Wash thoroughly, paying special attention to the area where your surgery will be performed.  Thoroughly rinse your body with warm water from the neck down.  DO NOT shower/wash with your normal soap after using and rinsing off the CHG Soap.  Pat yourself dry with a CLEAN TOWEL.  Wear CLEAN PAJAMAS to bed the night before surgery  Place CLEAN SHEETS on your bed the night before your surgery  DO NOT SLEEP WITH  PETS.   Day of Surgery: Shower with CHG soap. Do not wear jewelry, make up, nail polish, gel polish, artificial nails, or any other type of covering on natural nails including finger and toenails. If patients have artificial nails, gel coating, etc. that need to be removed by a nail salon please have this removed prior to surgery. Surgery may need to be canceled/delayed if the surgeon/anesthesiologist feels like the patient is unable to be adequately monitored. Do not wear lotions, powders, perfumes, or deodorant. Do not shave 48 hours prior to surgery.   Do not bring valuables to the hospital. [facility] is not responsible for any belongings or valuables. Wear Clean/Comfortable clothing the morning of surgery Remember to brush your teeth WITH YOUR REGULAR TOOTHPASTE.   Please read over the following fact sheets that you  were given.

## 2021-04-06 ENCOUNTER — Encounter (HOSPITAL_COMMUNITY)
Admission: RE | Admit: 2021-04-06 | Discharge: 2021-04-06 | Disposition: A | Discharge status: Home / Self Care | Payer: Medicare HMO | Source: Ambulatory Visit | Attending: Neurological Surgery | Admitting: Neurological Surgery

## 2021-04-06 ENCOUNTER — Encounter (HOSPITAL_COMMUNITY): Payer: Self-pay

## 2021-04-06 ENCOUNTER — Other Ambulatory Visit: Payer: Self-pay

## 2021-04-06 ENCOUNTER — Other Ambulatory Visit: Payer: Self-pay | Admitting: Neurological Surgery

## 2021-04-06 VITALS — BP 116/76 | HR 77 | Temp 98.2°F | Resp 18 | Ht 66.0 in | Wt 249.0 lb

## 2021-04-06 DIAGNOSIS — E119 Type 2 diabetes mellitus without complications: Secondary | ICD-10-CM | POA: Insufficient documentation

## 2021-04-06 DIAGNOSIS — Z01812 Encounter for preprocedural laboratory examination: Secondary | ICD-10-CM | POA: Insufficient documentation

## 2021-04-06 DIAGNOSIS — Z01818 Encounter for other preprocedural examination: Secondary | ICD-10-CM

## 2021-04-06 DIAGNOSIS — I2510 Atherosclerotic heart disease of native coronary artery without angina pectoris: Secondary | ICD-10-CM | POA: Insufficient documentation

## 2021-04-06 LAB — CBC
HCT: 43.4 % (ref 36.0–46.0)
Hemoglobin: 13.5 g/dL (ref 12.0–15.0)
MCH: 29 pg (ref 26.0–34.0)
MCHC: 31.1 g/dL (ref 30.0–36.0)
MCV: 93.3 fL (ref 80.0–100.0)
Platelets: 304 10*3/uL (ref 150–400)
RBC: 4.65 MIL/uL (ref 3.87–5.11)
RDW: 13.4 % (ref 11.5–15.5)
WBC: 7.4 10*3/uL (ref 4.0–10.5)
nRBC: 0 % (ref 0.0–0.2)

## 2021-04-06 LAB — BASIC METABOLIC PANEL
Anion gap: 9 (ref 5–15)
BUN: 16 mg/dL (ref 8–23)
CO2: 28 mmol/L (ref 22–32)
Calcium: 9.6 mg/dL (ref 8.9–10.3)
Chloride: 102 mmol/L (ref 98–111)
Creatinine, Ser: 0.86 mg/dL (ref 0.44–1.00)
GFR, Estimated: >60 mL/min (ref >60)
Glucose, Bld: 159 mg/dL — ABNORMAL HIGH (ref 70–99)
Potassium: 4.1 mmol/L (ref 3.5–5.1)
Sodium: 139 mmol/L (ref 135–145)

## 2021-04-06 LAB — SURGICAL PCR SCREEN
MRSA, PCR: NEGATIVE
Staphylococcus aureus: NEGATIVE

## 2021-04-06 LAB — HEMOGLOBIN A1C
Hgb A1c MFr Bld: 5.8 % — ABNORMAL HIGH (ref 4.8–5.6)
Mean Plasma Glucose: 120 mg/dL

## 2021-04-06 LAB — TYPE AND SCREEN
ABO/RH(D): A POS
Antibody Screen: NEGATIVE

## 2021-04-06 LAB — GLUCOSE, CAPILLARY: Glucose-Capillary: 188 mg/dL — ABNORMAL HIGH (ref 70–99)

## 2021-04-06 NOTE — Progress Notes (Signed)
°   04/06/21 1414  OBSTRUCTIVE SLEEP APNEA  Have you ever been diagnosed with sleep apnea through a sleep study? No  Do you snore loudly (loud enough to be heard through closed doors)?  1  Do you often feel tired, fatigued, or sleepy during the daytime (such as falling asleep during driving or talking to someone)? 1  Has anyone observed you stop breathing during your sleep? 1  Do you have, or are you being treated for high blood pressure? 1  BMI more than 35 kg/m2? 1  Age > 50 (1-yes) 1  Neck circumference greater than:Female 16 inches or larger, Female 17inches or larger? 0  Female Gender (Yes=1) 0  Obstructive Sleep Apnea Score 6  Score 5 or greater  Results sent to PCP

## 2021-04-06 NOTE — Progress Notes (Signed)
PCP - Delman Cheadle, PA Cardiologist - denies  PPM/ICD - n/a  Chest x-ray - n/a EKG - 05/11/20 Stress Test - denies ECHO - denies Cardiac Cath - denies  Sleep Study - denies; stop bang +, results sent to PCP CPAP - denies  Fasting Blood Sugar - 98-112 Checks Blood Sugar 1 times a week Last A1C reported to be 5.2. Will collect A1C today. CBG at PAT 188  Blood Thinner Instructions: n/s Aspirin Instructions: n/a  NPO at MD  COVID TEST- Not indicated. Pt tested + for covid on 03/12/21.     Anesthesia review: No  Patient denies shortness of breath, fever, cough and chest pain at PAT appointment   All instructions explained to the patient, with a verbal understanding of the material. Patient agrees to go over the instructions while at home for a better understanding. Patient also instructed to self quarantine after being tested for COVID-19. The opportunity to ask questions was provided.

## 2021-04-08 ENCOUNTER — Other Ambulatory Visit: Payer: Self-pay | Admitting: Neurological Surgery

## 2021-04-13 ENCOUNTER — Inpatient Hospital Stay (HOSPITAL_COMMUNITY): Payer: Medicare HMO | Admitting: Anesthesiology

## 2021-04-13 ENCOUNTER — Inpatient Hospital Stay (HOSPITAL_COMMUNITY)
Admission: RE | Admit: 2021-04-13 | Discharge: 2021-04-14 | DRG: 472 | Disposition: A | Discharge status: Home / Self Care | Payer: Medicare HMO | Attending: Neurological Surgery | Admitting: Neurological Surgery

## 2021-04-13 ENCOUNTER — Inpatient Hospital Stay (HOSPITAL_COMMUNITY): Payer: Medicare HMO

## 2021-04-13 ENCOUNTER — Encounter (HOSPITAL_COMMUNITY)
Admission: RE | Disposition: A | Discharge status: Home / Self Care | Payer: Self-pay | Source: Home / Self Care | Attending: Neurological Surgery

## 2021-04-13 ENCOUNTER — Other Ambulatory Visit: Payer: Self-pay

## 2021-04-13 ENCOUNTER — Encounter (HOSPITAL_COMMUNITY): Payer: Self-pay | Admitting: Neurological Surgery

## 2021-04-13 DIAGNOSIS — I10 Essential (primary) hypertension: Secondary | ICD-10-CM | POA: Diagnosis present

## 2021-04-13 DIAGNOSIS — Z79899 Other long term (current) drug therapy: Secondary | ICD-10-CM | POA: Diagnosis not present

## 2021-04-13 DIAGNOSIS — G8929 Other chronic pain: Secondary | ICD-10-CM | POA: Diagnosis present

## 2021-04-13 DIAGNOSIS — M4713 Other spondylosis with myelopathy, cervicothoracic region: Secondary | ICD-10-CM | POA: Diagnosis present

## 2021-04-13 DIAGNOSIS — M4712 Other spondylosis with myelopathy, cervical region: Secondary | ICD-10-CM | POA: Diagnosis present

## 2021-04-13 DIAGNOSIS — K219 Gastro-esophageal reflux disease without esophagitis: Secondary | ICD-10-CM | POA: Diagnosis present

## 2021-04-13 DIAGNOSIS — M50022 Cervical disc disorder at C5-C6 level with myelopathy: Secondary | ICD-10-CM | POA: Diagnosis present

## 2021-04-13 DIAGNOSIS — Z96611 Presence of right artificial shoulder joint: Secondary | ICD-10-CM | POA: Diagnosis present

## 2021-04-13 DIAGNOSIS — M4803 Spinal stenosis, cervicothoracic region: Secondary | ICD-10-CM | POA: Diagnosis present

## 2021-04-13 DIAGNOSIS — M4722 Other spondylosis with radiculopathy, cervical region: Secondary | ICD-10-CM | POA: Diagnosis present

## 2021-04-13 DIAGNOSIS — F32A Depression, unspecified: Secondary | ICD-10-CM | POA: Diagnosis present

## 2021-04-13 DIAGNOSIS — M2578 Osteophyte, vertebrae: Secondary | ICD-10-CM | POA: Diagnosis present

## 2021-04-13 DIAGNOSIS — M4723 Other spondylosis with radiculopathy, cervicothoracic region: Secondary | ICD-10-CM | POA: Diagnosis present

## 2021-04-13 DIAGNOSIS — Z833 Family history of diabetes mellitus: Secondary | ICD-10-CM | POA: Diagnosis not present

## 2021-04-13 DIAGNOSIS — M50122 Cervical disc disorder at C5-C6 level with radiculopathy: Secondary | ICD-10-CM | POA: Diagnosis present

## 2021-04-13 DIAGNOSIS — M4802 Spinal stenosis, cervical region: Secondary | ICD-10-CM | POA: Diagnosis present

## 2021-04-13 DIAGNOSIS — Z823 Family history of stroke: Secondary | ICD-10-CM | POA: Diagnosis not present

## 2021-04-13 DIAGNOSIS — M5412 Radiculopathy, cervical region: Secondary | ICD-10-CM | POA: Diagnosis present

## 2021-04-13 DIAGNOSIS — F419 Anxiety disorder, unspecified: Secondary | ICD-10-CM | POA: Diagnosis present

## 2021-04-13 DIAGNOSIS — Z91030 Bee allergy status: Secondary | ICD-10-CM | POA: Diagnosis not present

## 2021-04-13 DIAGNOSIS — Z9049 Acquired absence of other specified parts of digestive tract: Secondary | ICD-10-CM | POA: Diagnosis not present

## 2021-04-13 DIAGNOSIS — E119 Type 2 diabetes mellitus without complications: Secondary | ICD-10-CM | POA: Diagnosis present

## 2021-04-13 DIAGNOSIS — Z419 Encounter for procedure for purposes other than remedying health state, unspecified: Secondary | ICD-10-CM

## 2021-04-13 DIAGNOSIS — M170 Bilateral primary osteoarthritis of knee: Secondary | ICD-10-CM | POA: Diagnosis present

## 2021-04-13 DIAGNOSIS — Z885 Allergy status to narcotic agent status: Secondary | ICD-10-CM

## 2021-04-13 HISTORY — PX: ANTERIOR CERVICAL DECOMP/DISCECTOMY FUSION: SHX1161

## 2021-04-13 LAB — GLUCOSE, CAPILLARY
Glucose-Capillary: 106 mg/dL — ABNORMAL HIGH (ref 70–99)
Glucose-Capillary: 143 mg/dL — ABNORMAL HIGH (ref 70–99)
Glucose-Capillary: 176 mg/dL — ABNORMAL HIGH (ref 70–99)
Glucose-Capillary: 123 mg/dL — ABNORMAL HIGH (ref 70–99)

## 2021-04-13 LAB — ABO/RH: ABO/RH(D): A POS

## 2021-04-13 SURGERY — ANTERIOR CERVICAL DECOMPRESSION/DISCECTOMY FUSION 3 LEVELS
Anesthesia: General

## 2021-04-13 MED ORDER — HYDROMORPHONE HCL 1 MG/ML IJ SOLN
INTRAMUSCULAR | Status: AC
  Administered 2021-04-13: 0.5 mg via INTRAVENOUS
  Filled 2021-04-13: qty 1

## 2021-04-13 MED ORDER — DULOXETINE HCL 30 MG PO CPEP
60.0000 mg | ORAL_CAPSULE | Freq: Every day | ORAL | Status: DC
Start: 2021-04-14 — End: 2021-04-14

## 2021-04-13 MED ORDER — THROMBIN 5000 UNITS EX SOLR
CUTANEOUS | Status: DC | PRN
  Administered 2021-04-13 (×2): 5000 [IU] via TOPICAL

## 2021-04-13 MED ORDER — FLUOXETINE HCL 20 MG PO CAPS: 40.0000 mg | ORAL_CAPSULE | Freq: Every day | ORAL | Status: DC

## 2021-04-13 MED ORDER — ONDANSETRON HCL 4 MG/2ML IJ SOLN: 4.0000 mg | Freq: Four times a day (QID) | INTRAMUSCULAR | Status: DC | PRN

## 2021-04-13 MED ORDER — MEPERIDINE HCL 25 MG/ML IJ SOLN: 6.2500 mg | INTRAMUSCULAR | Status: DC | PRN

## 2021-04-13 MED ORDER — CHLORHEXIDINE GLUCONATE CLOTH 2 % EX PADS: 6.0000 | MEDICATED_PAD | Freq: Once | CUTANEOUS | Status: DC

## 2021-04-13 MED ORDER — PROPOFOL 10 MG/ML IV BOLUS
INTRAVENOUS | Status: AC
  Filled 2021-04-13: qty 20

## 2021-04-13 MED ORDER — LIDOCAINE 2% (20 MG/ML) 5 ML SYRINGE
INTRAMUSCULAR | Status: DC | PRN
  Administered 2021-04-13: 60 mg via INTRAVENOUS

## 2021-04-13 MED ORDER — GABAPENTIN 400 MG PO CAPS
800.0000 mg | ORAL_CAPSULE | Freq: Four times a day (QID) | ORAL | Status: DC
  Administered 2021-04-13 (×2): 800 mg via ORAL
  Filled 2021-04-13 (×2): qty 2

## 2021-04-13 MED ORDER — FLUOXETINE HCL 20 MG PO CAPS: 80.0000 mg | ORAL_CAPSULE | Freq: Every day | ORAL | Status: DC

## 2021-04-13 MED ORDER — ACETAMINOPHEN 160 MG/5ML PO SOLN: 325.0000 mg | Freq: Once | ORAL | Status: DC | PRN

## 2021-04-13 MED ORDER — METHOCARBAMOL 500 MG PO TABS
500.0000 mg | ORAL_TABLET | Freq: Four times a day (QID) | ORAL | Status: DC | PRN
  Administered 2021-04-13 – 2021-04-14 (×2): 500 mg via ORAL
  Filled 2021-04-13 (×2): qty 1

## 2021-04-13 MED ORDER — HEMOSTATIC AGENTS (NO CHARGE) OPTIME
TOPICAL | Status: DC | PRN
Start: 2021-04-13 — End: 2021-04-13
  Administered 2021-04-13: 1 via TOPICAL

## 2021-04-13 MED ORDER — MIDAZOLAM HCL 5 MG/5ML IJ SOLN
INTRAMUSCULAR | Status: DC | PRN
  Administered 2021-04-13: 2 mg via INTRAVENOUS

## 2021-04-13 MED ORDER — PROMETHAZINE HCL 12.5 MG PO TABS: 12.5000 mg | ORAL_TABLET | Freq: Four times a day (QID) | ORAL | Status: DC | PRN

## 2021-04-13 MED ORDER — CEFAZOLIN SODIUM-DEXTROSE 2-4 GM/100ML-% IV SOLN
2.0000 g | Freq: Three times a day (TID) | INTRAVENOUS | Status: AC
  Administered 2021-04-13 (×2): 2 g via INTRAVENOUS
  Filled 2021-04-13 (×2): qty 100

## 2021-04-13 MED ORDER — POTASSIUM 99 MG PO TABS: 99.0000 mg | ORAL_TABLET | Freq: Every day | ORAL | Status: DC

## 2021-04-13 MED ORDER — DOCUSATE SODIUM 100 MG PO CAPS
100.0000 mg | ORAL_CAPSULE | Freq: Two times a day (BID) | ORAL | Status: DC
  Administered 2021-04-13 (×2): 100 mg via ORAL
  Filled 2021-04-13 (×2): qty 1

## 2021-04-13 MED ORDER — TRAMADOL HCL 50 MG PO TABS
50.0000 mg | ORAL_TABLET | Freq: Four times a day (QID) | ORAL | Status: DC | PRN
  Filled 2021-04-13: qty 1

## 2021-04-13 MED ORDER — MORPHINE SULFATE (PF) 2 MG/ML IV SOLN: 2.0000 mg | INTRAVENOUS | Status: DC | PRN

## 2021-04-13 MED ORDER — ONDANSETRON HCL 4 MG/2ML IJ SOLN
INTRAMUSCULAR | Status: AC
  Filled 2021-04-13: qty 2

## 2021-04-13 MED ORDER — MIDAZOLAM HCL 2 MG/2ML IJ SOLN
INTRAMUSCULAR | Status: AC
  Filled 2021-04-13: qty 2

## 2021-04-13 MED ORDER — METHOCARBAMOL 1000 MG/10ML IJ SOLN
500.0000 mg | Freq: Four times a day (QID) | INTRAVENOUS | Status: DC | PRN
  Filled 2021-04-13: qty 5

## 2021-04-13 MED ORDER — CHLORHEXIDINE GLUCONATE 0.12 % MT SOLN
15.0000 mL | Freq: Once | OROMUCOSAL | Status: AC
  Administered 2021-04-13: 15 mL via OROMUCOSAL
  Filled 2021-04-13: qty 15

## 2021-04-13 MED ORDER — ATORVASTATIN CALCIUM 10 MG PO TABS: 20.0000 mg | ORAL_TABLET | Freq: Every day | ORAL | Status: DC

## 2021-04-13 MED ORDER — ALUM & MAG HYDROXIDE-SIMETH 200-200-20 MG/5ML PO SUSP: 30.0000 mL | Freq: Four times a day (QID) | ORAL | Status: DC | PRN

## 2021-04-13 MED ORDER — OXYCODONE HCL 5 MG PO TABS: 5.0000 mg | ORAL_TABLET | ORAL | Status: DC | PRN

## 2021-04-13 MED ORDER — PHENYLEPHRINE 40 MCG/ML (10ML) SYRINGE FOR IV PUSH (FOR BLOOD PRESSURE SUPPORT)
PREFILLED_SYRINGE | INTRAVENOUS | Status: DC | PRN
  Administered 2021-04-13: 160 ug via INTRAVENOUS
  Administered 2021-04-13 (×2): 80 ug via INTRAVENOUS

## 2021-04-13 MED ORDER — LIDOCAINE 2% (20 MG/ML) 5 ML SYRINGE
INTRAMUSCULAR | Status: AC
  Filled 2021-04-13: qty 5

## 2021-04-13 MED ORDER — CEFAZOLIN SODIUM-DEXTROSE 2-4 GM/100ML-% IV SOLN
2.0000 g | INTRAVENOUS | Status: AC
  Administered 2021-04-13: 2 g via INTRAVENOUS
  Filled 2021-04-13: qty 100

## 2021-04-13 MED ORDER — EPHEDRINE SULFATE-NACL 50-0.9 MG/10ML-% IV SOSY
PREFILLED_SYRINGE | INTRAVENOUS | Status: DC | PRN
  Administered 2021-04-13 (×2): 5 mg via INTRAVENOUS

## 2021-04-13 MED ORDER — LACTATED RINGERS IV SOLN: INTRAVENOUS | Status: DC

## 2021-04-13 MED ORDER — PHENYLEPHRINE HCL (PRESSORS) 10 MG/ML IV SOLN
INTRAVENOUS | Status: AC
  Filled 2021-04-13: qty 1

## 2021-04-13 MED ORDER — MENTHOL 3 MG MT LOZG: 1.0000 | LOZENGE | OROMUCOSAL | Status: DC | PRN

## 2021-04-13 MED ORDER — SODIUM CHLORIDE 0.9 % IV SOLN: 250.0000 mL | INTRAVENOUS | Status: DC

## 2021-04-13 MED ORDER — FENTANYL CITRATE (PF) 100 MCG/2ML IJ SOLN
INTRAMUSCULAR | Status: DC | PRN
  Administered 2021-04-13 (×5): 50 ug via INTRAVENOUS

## 2021-04-13 MED ORDER — ROCURONIUM BROMIDE 10 MG/ML (PF) SYRINGE
PREFILLED_SYRINGE | INTRAVENOUS | Status: DC | PRN
  Administered 2021-04-13: 40 mg via INTRAVENOUS
  Administered 2021-04-13 (×2): 20 mg via INTRAVENOUS
  Administered 2021-04-13: 60 mg via INTRAVENOUS

## 2021-04-13 MED ORDER — PHENYLEPHRINE HCL-NACL 20-0.9 MG/250ML-% IV SOLN
INTRAVENOUS | Status: DC | PRN
  Administered 2021-04-13: 50 ug/min via INTRAVENOUS

## 2021-04-13 MED ORDER — THROMBIN 5000 UNITS EX SOLR
OROMUCOSAL | Status: DC | PRN
  Administered 2021-04-13 (×2): 5 mL via TOPICAL

## 2021-04-13 MED ORDER — DEXAMETHASONE SODIUM PHOSPHATE 10 MG/ML IJ SOLN
INTRAMUSCULAR | Status: AC
  Filled 2021-04-13: qty 1

## 2021-04-13 MED ORDER — ACETAMINOPHEN 325 MG PO TABS: 325.0000 mg | ORAL_TABLET | Freq: Once | ORAL | Status: DC | PRN

## 2021-04-13 MED ORDER — TRIAMCINOLONE ACETONIDE 40 MG/ML IJ SUSP
INTRAMUSCULAR | Status: DC | PRN
  Administered 2021-04-13: 40 mg via INTRAMUSCULAR

## 2021-04-13 MED ORDER — SODIUM CHLORIDE 0.9% FLUSH: 3.0000 mL | INTRAVENOUS | Status: DC | PRN

## 2021-04-13 MED ORDER — THROMBIN 5000 UNITS EX SOLR
CUTANEOUS | Status: AC
  Filled 2021-04-13: qty 5000

## 2021-04-13 MED ORDER — PROPOFOL 10 MG/ML IV BOLUS
INTRAVENOUS | Status: DC | PRN
Start: 2021-04-13 — End: 2021-04-13
  Administered 2021-04-13: 150 mg via INTRAVENOUS

## 2021-04-13 MED ORDER — TRIAMCINOLONE ACETONIDE 40 MG/ML IJ SUSP
INTRAMUSCULAR | Status: AC
  Filled 2021-04-13: qty 5

## 2021-04-13 MED ORDER — DEXAMETHASONE SODIUM PHOSPHATE 4 MG/ML IJ SOLN
INTRAMUSCULAR | Status: DC | PRN
Start: 2021-04-13 — End: 2021-04-13
  Administered 2021-04-13: 4 mg via INTRAVENOUS

## 2021-04-13 MED ORDER — BUPROPION HCL ER (XL) 300 MG PO TB24
450.0000 mg | ORAL_TABLET | Freq: Every day | ORAL | Status: DC
  Filled 2021-04-13: qty 1

## 2021-04-13 MED ORDER — ONDANSETRON HCL 4 MG PO TABS: 4.0000 mg | ORAL_TABLET | Freq: Four times a day (QID) | ORAL | Status: DC | PRN

## 2021-04-13 MED ORDER — SODIUM CHLORIDE 0.9% FLUSH
3.0000 mL | Freq: Two times a day (BID) | INTRAVENOUS | Status: DC
  Administered 2021-04-13: 3 mL via INTRAVENOUS

## 2021-04-13 MED ORDER — ACETAMINOPHEN 10 MG/ML IV SOLN: 1000.0000 mg | Freq: Once | INTRAVENOUS | Status: DC | PRN

## 2021-04-13 MED ORDER — ORAL CARE MOUTH RINSE: 15.0000 mL | Freq: Once | OROMUCOSAL | Status: AC

## 2021-04-13 MED ORDER — ROCURONIUM BROMIDE 10 MG/ML (PF) SYRINGE
PREFILLED_SYRINGE | INTRAVENOUS | Status: AC
  Filled 2021-04-13: qty 10

## 2021-04-13 MED ORDER — PHENYLEPHRINE 40 MCG/ML (10ML) SYRINGE FOR IV PUSH (FOR BLOOD PRESSURE SUPPORT)
PREFILLED_SYRINGE | INTRAVENOUS | Status: AC
  Filled 2021-04-13: qty 10

## 2021-04-13 MED ORDER — PHENOL 1.4 % MT LIQD: 1.0000 | OROMUCOSAL | Status: DC | PRN

## 2021-04-13 MED ORDER — PROMETHAZINE HCL 25 MG/ML IJ SOLN: 6.2500 mg | INTRAMUSCULAR | Status: DC | PRN

## 2021-04-13 MED ORDER — OXYCODONE HCL 5 MG PO TABS
10.0000 mg | ORAL_TABLET | ORAL | Status: DC | PRN
  Administered 2021-04-13 – 2021-04-14 (×4): 10 mg via ORAL
  Filled 2021-04-13 (×4): qty 2

## 2021-04-13 MED ORDER — FENTANYL CITRATE (PF) 250 MCG/5ML IJ SOLN
INTRAMUSCULAR | Status: AC
  Filled 2021-04-13: qty 5

## 2021-04-13 MED ORDER — EMPAGLIFLOZIN 10 MG PO TABS
10.0000 mg | ORAL_TABLET | Freq: Every day | ORAL | Status: DC
  Administered 2021-04-13: 10 mg via ORAL
  Filled 2021-04-13: qty 1

## 2021-04-13 MED ORDER — 0.9 % SODIUM CHLORIDE (POUR BTL) OPTIME
TOPICAL | Status: DC | PRN
  Administered 2021-04-13: 1000 mL

## 2021-04-13 MED ORDER — ACETAMINOPHEN 10 MG/ML IV SOLN
INTRAVENOUS | Status: AC
  Administered 2021-04-13: 1000 mg via INTRAVENOUS
  Filled 2021-04-13: qty 100

## 2021-04-13 MED ORDER — AMISULPRIDE (ANTIEMETIC) 5 MG/2ML IV SOLN: 10.0000 mg | Freq: Once | INTRAVENOUS | Status: DC | PRN

## 2021-04-13 MED ORDER — HYDROMORPHONE HCL 1 MG/ML IJ SOLN
0.2500 mg | INTRAMUSCULAR | Status: DC | PRN
  Administered 2021-04-13: 0.25 mg via INTRAVENOUS

## 2021-04-13 MED ORDER — SUGAMMADEX SODIUM 200 MG/2ML IV SOLN
INTRAVENOUS | Status: DC | PRN
  Administered 2021-04-13: 200 mg via INTRAVENOUS

## 2021-04-13 SURGICAL SUPPLY — 64 items
ALLOGRAFT 5X14X11 (Bone Implant) ×1 IMPLANT
ALLOGRAFT 7X14X11 (Bone Implant) ×1 IMPLANT
ALLOGRAFT CA 6X14X11 (Bone Implant) ×2 IMPLANT
BAG COUNTER SPONGE SURGICOUNT (BAG) ×3 IMPLANT
BAND RUBBER #18 3X1/16 STRL (MISCELLANEOUS) ×4 IMPLANT
BENZOIN TINCTURE PRP APPL 2/3 (GAUZE/BANDAGES/DRESSINGS) IMPLANT
BIT DRILL NEURO 2X3.1 SFT TUCH (MISCELLANEOUS) ×1 IMPLANT
BLADE CLIPPER SURG (BLADE) IMPLANT
BUR CARBIDE MATCH 3.0 (BURR) ×2 IMPLANT
CANISTER SUCT 3000ML PPV (MISCELLANEOUS) ×2 IMPLANT
CARTRIDGE OIL MAESTRO DRILL (MISCELLANEOUS) ×1 IMPLANT
COVER MAYO STAND STRL (DRAPES) ×4 IMPLANT
DIFFUSER DRILL AIR PNEUMATIC (MISCELLANEOUS) ×2 IMPLANT
DRAPE C-ARM 42X72 X-RAY (DRAPES) ×3 IMPLANT
DRAPE HALF SHEET 40X57 (DRAPES) IMPLANT
DRAPE LAPAROTOMY 100X72X124 (DRAPES) ×2 IMPLANT
DRAPE MICROSCOPE LEICA (MISCELLANEOUS) ×2 IMPLANT
DRILL NEURO 2X3.1 SOFT TOUCH (MISCELLANEOUS) ×2
DRSG OPSITE POSTOP 4X6 (GAUZE/BANDAGES/DRESSINGS) ×1 IMPLANT
DURAPREP 6ML APPLICATOR 50/CS (WOUND CARE) ×2 IMPLANT
ELECT COATED BLADE 2.86 ST (ELECTRODE) ×2 IMPLANT
ELECT REM PT RETURN 9FT ADLT (ELECTROSURGICAL) ×2
ELECTRODE REM PT RTRN 9FT ADLT (ELECTROSURGICAL) ×1 IMPLANT
EVACUATOR 1/8 PVC DRAIN (DRAIN) ×1 IMPLANT
GAUZE 4X4 16PLY ~~LOC~~+RFID DBL (SPONGE) ×1 IMPLANT
GLOVE EXAM NITRILE LRG STRL (GLOVE) ×1 IMPLANT
GLOVE EXAM NITRILE XL STR (GLOVE) IMPLANT
GLOVE EXAM NITRILE XS STR PU (GLOVE) IMPLANT
GLOVE SRG 8 PF TXTR STRL LF DI (GLOVE) ×1 IMPLANT
GLOVE SURG LTX SZ8 (GLOVE) ×4 IMPLANT
GLOVE SURG UNDER POLY LF SZ6.5 (GLOVE) ×2 IMPLANT
GLOVE SURG UNDER POLY LF SZ8 (GLOVE) ×2
GLOVE SURG UNDER POLY LF SZ8.5 (GLOVE) ×1 IMPLANT
GOWN STRL REUS W/ TWL LRG LVL3 (GOWN DISPOSABLE) IMPLANT
GOWN STRL REUS W/ TWL XL LVL3 (GOWN DISPOSABLE) ×1 IMPLANT
GOWN STRL REUS W/TWL 2XL LVL3 (GOWN DISPOSABLE) IMPLANT
GOWN STRL REUS W/TWL LRG LVL3 (GOWN DISPOSABLE) ×2
GOWN STRL REUS W/TWL XL LVL3 (GOWN DISPOSABLE) ×4
HEMOSTAT POWDER KIT SURGIFOAM (HEMOSTASIS) ×3 IMPLANT
KIT BASIN OR (CUSTOM PROCEDURE TRAY) ×2 IMPLANT
KIT TURNOVER KIT B (KITS) ×2 IMPLANT
NDL SPNL 18GX3.5 QUINCKE PK (NEEDLE) ×1 IMPLANT
NEEDLE HYPO 22GX1.5 SAFETY (NEEDLE) ×2 IMPLANT
NEEDLE SPNL 18GX3.5 QUINCKE PK (NEEDLE) ×2 IMPLANT
NS IRRIG 1000ML POUR BTL (IV SOLUTION) ×2 IMPLANT
OIL CARTRIDGE MAESTRO DRILL (MISCELLANEOUS) ×2
PACK LAMINECTOMY NEURO (CUSTOM PROCEDURE TRAY) ×2 IMPLANT
PAD ARMBOARD 7.5X6 YLW CONV (MISCELLANEOUS) ×4 IMPLANT
PIN DISTRACTION 14MM (PIN) ×4 IMPLANT
PLATE CERV CST OZARK 57 3LVL (Plate) ×1 IMPLANT
SCREW VA ST OZARK 4.5X14 (Screw) ×1 IMPLANT
SCREW VA ST OZARK 4X14 (Screw) ×7 IMPLANT
SCREW VA ST OZARK 4X14 FX (Screw) IMPLANT
SCREW VA ST OZARK 4X16 (Plate) ×2 IMPLANT
SPONGE INTESTINAL PEANUT (DISPOSABLE) ×3 IMPLANT
SPONGE SURGIFOAM ABS GEL SZ50 (HEMOSTASIS) ×2 IMPLANT
SPONGE T-LAP 4X18 ~~LOC~~+RFID (SPONGE) ×1 IMPLANT
STAPLER VISISTAT 35W (STAPLE) ×1 IMPLANT
STRIP CLOSURE SKIN 1/2X4 (GAUZE/BANDAGES/DRESSINGS) ×2 IMPLANT
TAPE SURG TRANSPORE 1 IN (GAUZE/BANDAGES/DRESSINGS) ×1 IMPLANT
TAPE SURGICAL TRANSPORE 1 IN (GAUZE/BANDAGES/DRESSINGS) ×2
TOWEL GREEN STERILE (TOWEL DISPOSABLE) ×2 IMPLANT
TOWEL GREEN STERILE FF (TOWEL DISPOSABLE) ×2 IMPLANT
WATER STERILE IRR 1000ML POUR (IV SOLUTION) ×2 IMPLANT

## 2021-04-13 NOTE — Progress Notes (Signed)
Orthopedic Tech Progress Note Patient Details:  Lorraine Horton [birth year] [id number]  RN stated "patient has on COLLAR"  Patient ID: Lorraine Horton, female   DOB: 1961/01/16, 61 y.o.   MRN: [id number]  Donald Pore 04/13/2021, 3:28 PM

## 2021-04-13 NOTE — Anesthesia Postprocedure Evaluation (Signed)
Anesthesia Post Note  Patient: Lorraine Horton  Procedure(s) Performed: Anterior Cervical Decompression Fusion Cervical five-six, Cervical six-seven, Cervical seven-thoracic one     Patient location during evaluation: PACU Anesthesia Type: General Level of consciousness: awake and alert Pain management: pain level controlled Vital Signs Assessment: post-procedure vital signs reviewed and stable Respiratory status: spontaneous breathing, nonlabored ventilation, respiratory function stable and patient connected to nasal cannula oxygen Cardiovascular status: blood pressure returned to baseline and stable Postop Assessment: no apparent nausea or vomiting Anesthetic complications: no   No notable events documented.  Last Vitals:  Vitals:   04/13/21 1255 04/13/21 1555  BP: 137/73 122/62  Pulse: 80 79  Resp: 16 16  Temp:  36.4 C  SpO2: 98% 95%    Last Pain:  Vitals:   04/13/21 1555  TempSrc: Oral  PainSc:                  Shelton Silvas

## 2021-04-13 NOTE — H&P (Signed)
Providing Compassionate, Quality Care - Together  NEUROSURGERY HISTORY & PHYSICAL   Lorraine Horton is an 61 y.o. female.   Chief Complaint: Right greater than left upper extremity radiculopathy HPI: This is a 61 year old female with a history of chronic neck pain, right greater than left radiculopathy that has been resistant to conservative measures.  She complains of pain in the C6, C7 and C8 dermatomes, right greater than left.  She also has intermittent difficulty with dropping objects.  MRI revealed spondylosis with stenosis at C5-6, C6-7, C7-T1.  She failed conservative measures and presents today for surgical intervention in the form of an ACDF C5-T1.  Past Medical History:  Diagnosis Date   AC (acromioclavicular) joint bone spurs    Anxiety    Arthritis    chronic back, shoulder, knees   Bursitis    Depression    Diabetes mellitus without complication ([place])    Hypertension    no meds now    Past Surgical History:  Procedure Laterality Date   CHOLECYSTECTOMY  1996   KNEE SURGERY Bilateral    REVERSE SHOULDER ARTHROPLASTY Right 05/13/2020   Procedure: RIGHT REVERSE TOTAL SHOULDER ARTHROPLASTY;  Surgeon: Hiram Gash, MD;  Location: [facility];  Service: Orthopedics;  Laterality: Right;   TUBAL LIGATION  1992    Family History  Problem Relation Age of Onset   Stroke Mother    High blood pressure Mother    High blood pressure Father    Diabetes Father    Breast cancer Neg Hx    Social History:  reports that she has never smoked. She has never used smokeless tobacco. She reports current alcohol use. She reports current drug use. Drug: Marijuana.  Allergies:  Allergies  Allergen Reactions   Bee Venom Shortness Of Breath and Swelling   Codeine Nausea And Vomiting and Other (See Comments)    REACTION: Passes out in addition to nausea and vomiting   Darvocet [Propoxyphene N-Acetaminophen] Nausea And Vomiting and Other (See Comments)    REACTION:  passes out in addition to nausea and vomiting    Medications Prior to Admission  Medication Sig Dispense Refill   atorvastatin (LIPITOR) 20 MG tablet Take 20 mg by mouth daily.     buPROPion (WELLBUTRIN XL) 150 MG 24 hr tablet Take 450 mg by mouth daily.     DULoxetine (CYMBALTA) 60 MG capsule Take 60 mg by mouth daily.     empagliflozin (JARDIANCE) 10 MG TABS tablet Take 10 mg by mouth daily.     FLUoxetine (PROZAC) 40 MG capsule Take 80 mg by mouth daily.     gabapentin (NEURONTIN) 800 MG tablet Take 800 mg by mouth 4 (four) times daily.     Potassium 99 MG TABS Take 99 mg by mouth daily.     tiZANidine (ZANAFLEX) 4 MG tablet Take 4 mg by mouth 3 (three) times daily as needed for muscle spasms.     traMADol (ULTRAM) 50 MG tablet Take 1 tablet (50 mg total) by mouth every 6 (six) hours as needed for severe pain. 20 tablet 0   omeprazole (PRILOSEC) 20 MG capsule Take 1 capsule (20 mg total) by mouth daily. 30 days for gastroprotection while taking NSAIDs. (Patient not taking: Reported on 04/02/2021) 30 capsule 0   promethazine (PHENERGAN) 12.5 MG tablet Take 1 tablet (12.5 mg total) by mouth every 6 (six) hours as needed for nausea or vomiting. (Patient not taking: Reported on 04/02/2021) 20 tablet 0  Results for orders placed or performed during the hospital encounter of 04/13/21 (from the past 48 hour(s))  Glucose, capillary     Status: Abnormal   Collection Time: 04/13/21  6:29 AM  Result Value Ref Range   Glucose-Capillary 106 (H) 70 - 99 mg/dL    Comment: Glucose reference range applies only to samples taken after fasting for at least 8 hours.   No results found.  ROS All positives and negatives are listed in HPI above  Blood pressure (!) 105/53, pulse 74, temperature 97.8 F (36.6 C), temperature source Oral, resp. rate 18, height 5\' 6"  (1.676 m), weight 111.6 kg, SpO2 96 %. 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  All risks, benefits and expected outcomes have been discussed and agreed upon.  Informed consent obtained.  I answered all of her questions.   Thank you for allowing me to participate in this patient's care.  Please do not hesitate to call with questions or concerns.   Elwin Sleight, [facility] Cell: [phone]

## 2021-04-13 NOTE — Anesthesia Preprocedure Evaluation (Addendum)
Anesthesia Evaluation  Patient identified by MRN, date of birth, ID band Patient awake    Reviewed: Allergy & Precautions, NPO status , Patient's Chart, lab work & pertinent test results  Airway Mallampati: II  TM Distance: >3 FB Neck ROM: Full    Dental  (+) Teeth Intact, Dental Advisory Given   Pulmonary    breath sounds clear to auscultation       Cardiovascular hypertension,  Rhythm:Regular Rate:Normal     Neuro/Psych PSYCHIATRIC DISORDERS Anxiety Depression    GI/Hepatic Neg liver ROS, GERD  Medicated,  Endo/Other  diabetes  Renal/GU negative Renal ROS     Musculoskeletal  (+) Arthritis ,   Abdominal Normal abdominal exam  (+)   Peds  Hematology negative hematology ROS (+)   Anesthesia Other Findings   Reproductive/Obstetrics                            Anesthesia Physical Anesthesia Plan  ASA: 3  Anesthesia Plan: General   Post-op Pain Management:    Induction: Intravenous  PONV Risk Score and Plan: 4 or greater and Ondansetron, Dexamethasone, Midazolam and Scopolamine patch - Pre-op  Airway Management Planned: Oral ETT and Video Laryngoscope Planned  Additional Equipment: None  Intra-op Plan:   Post-operative Plan: Extubation in OR  Informed Consent: I have reviewed the patients History and Physical, chart, labs and discussed the procedure including the risks, benefits and alternatives for the proposed anesthesia with the patient or authorized representative who has indicated his/her understanding and acceptance.     Dental advisory given  Plan Discussed with: CRNA  Anesthesia Plan Comments:        Anesthesia Quick Evaluation

## 2021-04-13 NOTE — Anesthesia Procedure Notes (Signed)
Procedure Name: Intubation Date/Time: 04/13/2021 7:46 AM Performed by: Caren Macadam, CRNA Pre-anesthesia Checklist: Patient identified, Emergency Drugs available, Suction available and Patient being monitored Patient Re-evaluated:Patient Re-evaluated prior to induction Oxygen Delivery Method: Circle system utilized Preoxygenation: Pre-oxygenation with 100% oxygen Induction Type: IV induction Ventilation: Mask ventilation without difficulty Laryngoscope Size: Miller and 2 Grade View: Grade I Tube type: Oral Tube size: 7.0 mm Number of attempts: 1 Airway Equipment and Method: Stylet and Video-laryngoscopy Placement Confirmation: ETT inserted through vocal cords under direct vision, positive ETCO2 and breath sounds checked- equal and bilateral Secured at: 23 cm Tube secured with: Tape Dental Injury: Teeth and Oropharynx as per pre-operative assessment  Comments: Elective glide scope due to cervical spine

## 2021-04-13 NOTE — Transfer of Care (Signed)
Immediate Anesthesia Transfer of Care Note  Patient: Lorraine Horton  Procedure(s) Performed: Anterior Cervical Decompression Fusion Cervical five-six, Cervical six-seven, Cervical seven-thoracic one  Patient Location: PACU  Anesthesia Type:General  Level of Consciousness: awake and alert   Airway & Oxygen Therapy: Patient Spontanous Breathing and Patient connected to face mask oxygen  Post-op Assessment: Report given to RN and Post -op Vital signs reviewed and stable  Post vital signs: Reviewed and stable  Last Vitals:  Vitals Value Taken Time  BP 155/70 04/13/21 1125  Temp    Pulse 80 04/13/21 1126  Resp 10 04/13/21 1126  SpO2 99 % 04/13/21 1126  Vitals shown include unvalidated device data.  Last Pain:  Vitals:   04/13/21 0626  TempSrc: Oral      Patients Stated Pain Goal: 3 (04/13/21 5790)  Complications: No notable events documented.

## 2021-04-13 NOTE — Op Note (Signed)
Providing Compassionate, Quality Care - Together  Date of service: 04/13/2021  PREOP DIAGNOSIS: Cervical spondylosis with radiculopathy, C5-6, C6-7, C7-T1, right greater than left; mild cervical myelopathy  POSTOP DIAGNOSIS: Same  PROCEDURE: 1. Arthrodesis C5-6, C6/7, C7-T1, anterior interbody technique  2. Placement of intervertebral allograft device, 6 x 14 mm at C5-6, 5 x 14 mm at C6-7, 6 x 14 mm at C7-T1 3. Placement of anterior instrumentation consisting of interbody plate and screws -K2 M Ozark plate, 57 mm; C5: 4.0 x 60 mm screws bilaterally, C6: left 4.5 x 14 mm, right 4.0 x 14 mm, C7: 4.0 x 14 mm bilaterally, T1: 4.0 x 14 mm bilaterally 4. Discectomy at C5-6, C6-7, C7-T1 for decompression of spinal cord and exiting nerve roots  5. Use of morselized bone allograft  6.  Use of autograft, same incision 7. Use of intraoperative microscope  SURGEON: Dr. Monia Pouchroy Sayana Salley, DO  ASSISTANT: Dr. Lisbeth RenshawNeelesh Nundkumar, MD; Docia BarrierJosh McDaniel, NP  ANESTHESIA: General Endotracheal  EBL: 50 cc   SPECIMENS: None  DRAINS: None  COMPLICATIONS: None immediate  CONDITION: Hemodynamically stable to PACU  HISTORY: Lorraine Horton is a 61 y.o. y.o. female who initially presented to the outpatient clinic with signs and symptoms consistent with right upper extremity radiculopathy in the C6, C7 and C8 dermatomes. MRI demonstrated severe degenerative disc disease at C5-6 with moderate canal stenosis with inferiorly migrated disc osteophyte complex with cord compression, C6-7 had severe degenerative disc disease with severe bilateral neuroforaminal narrowing, C7-T1 had grade 1 anterior listhesis with moderate degenerative disc disease and severe right neuroforaminal narrowing.she failed conservative measures including pain control and was nodules persisted and further epidural steroid injections.  Treatment options were discussed including continued pain control, physical therapy, surgical decompression  instrumentation and fusion in the form of ACDF C5-T1. After all questions were answered, informed consent was obtained.  PROCEDURE IN DETAIL: The patient was brought to the operating room and transferred to the operative table. After induction of general anesthesia, the patient was positioned on the operative table in the supine position with all pressure points meticulously padded. The skin of the neck was then prepped and draped in the usual sterile fashion.  Physician driven timeout was performed.  After timeout was conducted, skin incision was then made sharply with a 10 blade and Bovie electrocautery was used to dissect the subcutaneous tissue until the platysma was identified. The platysma was then divided and undermined. The sternocleidomastoid muscle was then identified and, utilizing natural fascial planes in the neck, the prevertebral fascia was identified and the carotid sheath was retracted laterally and the trachea and esophagus retracted medially. Again using fluoroscopy, the correct disc space was identified. Bovie electrocautery was used to dissect in the subperiosteal plane and elevate the bilateral longus coli muscles at C5, C6, T1. Self-retaining retractors were then placed under the longus coli muscles bilaterally. At this point, the microscope was draped and brought into the field, and the remainder of the case was done under the microscope using microdissecting technique.  C5-6: Distraction pins were placed in midline above and below the disc space.  The disc space was placed in distraction.  The self-retaining retractors were placed under the longus coli bilaterally. The disc space was incised sharply and rongeurs were use to initially complete a discectomy. The high-speed drill was then used to complete discectomy until the posterior annulus was identified and removed and the posterior longitudinal ligament was identified. Using microcurettes, the PLL was elevated, and Kerrison  rongeurs were used to remove the posterior longitudinal ligament and the ventral thecal sac was identified. Using a combination of curettes and ronguers, complete decompression of the thecal sac and exiting nerve roots at this level was completed, and verified using micro-nerve hook.  The inferiorly migrated disc osteophyte complex was removed with a series of micro curettes and Kerrison rongeurs.  Again the epidural space was explored with micro nerve hook and of adequately decompressed.  The disc space was taken out of distraction.  Epidural hemostasis was achieved with Surgifoam.  Having completed our decompression, attention was turned to placement of the intervertebral spacer. Trial spacers were used to select a 6 mm graft. This structural allograft was then placed under direct visualization to be flush with the anterior vertebral body.  Distraction pin was removed from the superior vertebral body and hemostasis was achieved with Surgifoam.  C6-7: Distraction pins were placed in midline above and below the disc space.  The disc space was placed in distraction.  The self-retaining retractors were placed under the longus coli bilaterally. The disc space was incised sharply and rongeurs were use to initially complete a discectomy. The high-speed drill was then used to complete discectomy until the posterior annulus was identified and removed and the posterior longitudinal ligament was identified. Using microcurettes, the PLL was elevated, and Kerrison rongeurs were used to remove the posterior longitudinal ligament and the ventral thecal sac was identified. Using a combination of curettes and ronguers, complete decompression of the thecal sac and exiting nerve roots at this level was completed, and verified using micro-nerve hook.  Again the epidural space was explored with micro nerve hook and of adequately decompressed.  The disc space was taken out of distraction.  Epidural hemostasis was achieved with  Surgifoam.  Having completed our decompression, attention was turned to placement of the intervertebral spacer. Trial spacers were used to select a 5 mm graft. This structural allograft was then placed under direct visualization to be flush with the anterior vertebral body.  Distraction pin was removed from the superior vertebral body and hemostasis was achieved with Surgifoam.  C7-T1: Distraction pins were placed in midline above and below the disc space.  The disc space was placed in distraction.  The self-retaining retractors were placed under the longus coli bilaterally.  The disc space was incised sharply and rongeurs were use to initially complete a discectomy. The high-speed drill was then used to complete discectomy until the posterior annulus was identified and removed and the posterior longitudinal ligament was identified. Using microcurettes, the PLL was elevated, and Kerrison rongeurs were used to remove the posterior longitudinal ligament and the ventral thecal sac was identified. Using a combination of curettes and ronguers, complete decompression of the thecal sac and exiting nerve roots at this level was completed, and verified using micro-nerve hook.  Again the epidural space was explored with micro nerve hook and of adequately decompressed.  The disc space was taken out of distraction.  Epidural hemostasis was achieved with Surgifoam.  Having completed our decompression, attention was turned to placement of the intervertebral spacer. Trial spacers were used to select a 6 mm graft. This structural allograft was then placed under direct visualization to be flush with the anterior vertebral body.  Distraction pin was removed from the superior vertebral body and hemostasis was achieved with Surgifoam.  After placement of the intervertebral device, the above anterior cervical plate was selected, contoured and placed across the interspaces. Using a high-speed drill, the cortex of  the cervical  vertebral bodies was punctured, and screws inserted in the C5 bilaterally, C6 bilaterally, C7 bilaterally, T1 bilaterally.  There was appropriate bony purchase. Final fluoroscopic images in AP and lateral projections were taken to confirm good hardware placement.  The plate was final tightened to the manufacturer's recommendation and the screws were locked in place.  At this point, after all counts were verified to be correct, meticulous hemostasis was secured using a combination of bipolar electrocautery and passive hemostatics.  Kenalog was placed along the lateral esophagus.  Medium Hemovac was tunneled laterally and placed in the prevertebral space. Skin was closed with staples.  Sterile dressing was applied.  The patient tolerated the procedure well and was extubated in the room and taken to the postanesthesia care unit in stable condition.

## 2021-04-14 ENCOUNTER — Encounter (HOSPITAL_COMMUNITY): Payer: Self-pay | Admitting: Neurological Surgery

## 2021-04-14 LAB — GLUCOSE, CAPILLARY: Glucose-Capillary: 109 mg/dL — ABNORMAL HIGH (ref 70–99)

## 2021-04-14 MED ORDER — METHOCARBAMOL 500 MG PO TABS: 500.0000 mg | ORAL_TABLET | Freq: Four times a day (QID) | ORAL | 1 refills | Status: AC

## 2021-04-14 MED ORDER — OXYCODONE-ACETAMINOPHEN 5-325 MG PO TABS: 1.0000 | ORAL_TABLET | Freq: Four times a day (QID) | ORAL | 0 refills | Status: AC | PRN

## 2021-04-14 NOTE — Evaluation (Signed)
Occupational Therapy Evaluation Patient Details Name: Lorraine Horton MRN: [id number] DOB: [birth year] Today's Date: 04/14/2021   History of Present Illness 61 y/o female admitted on 04/13/21 following ACDF C5-T1. PMH: DM, HTN   Clinical Impression   Lorraine Horton is indep without AD at baseline, she lives in a one level home, 3 STE with her husband who can assist as needed. After review, pt verbalized understanding of cervical precautions and compensatory technique to maintain during ADLs. Overall she was supervision for ADLs and functional mobility, but noted to be mildly impulsive and benefited from verbal cues for safety and cervical precautions. Pt does not have further acute OT needs. Recommend d/c to home without OT follow up.      Recommendations for follow up therapy are one component of a multi-disciplinary discharge planning process, led by the attending physician.  Recommendations may be updated based on patient status, additional functional criteria and insurance authorization.   Follow Up Recommendations  No OT follow up       Patient can return home with the following A little help with walking and/or transfers;A little help with bathing/dressing/bathroom;Assist for transportation    Functional Status Assessment  Patient has had a recent decline in their functional status and demonstrates the ability to make significant improvements in function in a reasonable and predictable amount of time.        Precautions / Restrictions Precautions Precautions: Fall;Cervical Precaution Booklet Issued: Yes (comment) Precaution Comments: reviewed during ADLs and mobility Required Braces or Orthoses: Cervical Brace Cervical Brace: Hard collar;At all times Restrictions Weight Bearing Restrictions: No      Mobility Bed Mobility Overal bed mobility: Needs Assistance Bed Mobility: Rolling, Sidelying to Sit Rolling: Supervision Sidelying to sit: Supervision       General bed mobility  comments: verbal cues for log roll, however pt impulsively got OOB prior to bed flat & rails down. Verbally reviewed proper technique.    Transfers Overall transfer level: Needs assistance Equipment used: None Transfers: Sit to/from Stand Sit to Stand: Supervision           General transfer comment: verbal cues      Balance Overall balance assessment: Needs assistance Sitting-balance support: Feet supported Sitting balance-Leahy Scale: Good     Standing balance support: No upper extremity supported, During functional activity Standing balance-Leahy Scale: Fair             ADL either performed or assessed with clinical judgement   ADL Overall ADL's : Needs assistance/impaired     General ADL Comments: Overall pt required superivison for all ADLs with verbal cues to maintain cervical precautions due to impulsivity. Attempted to review brace don/doff but pt states "I know how to do that." Reviewed compensatory techniques to maintain safety and cervical precautions, pt demonstrated good understanding. pt cleared to shower without brace, recommended sit to shower with hand held shower head.     Vision Baseline Vision/History: 0 No visual deficits Ability to See in Adequate Light: 0 Adequate Vision Assessment?: No apparent visual deficits            Pertinent Vitals/Pain Pain Assessment Pain Assessment: No/denies pain     Hand Dominance     Extremity/Trunk Assessment Upper Extremity Assessment Upper Extremity Assessment: Overall WFL for tasks assessed   Lower Extremity Assessment Lower Extremity Assessment: Overall WFL for tasks assessed   Cervical / Trunk Assessment Cervical / Trunk Assessment: Neck Surgery   Communication Communication Communication: No difficulties   Cognition Arousal/Alertness: Awake/alert Behavior  During Therapy: WFL for tasks assessed/performed, Impulsive Overall Cognitive Status: Within Functional Limits for tasks assessed          General Comments: verbalized understanding of cervical precautions after review, pt impulsive with movement and requires verbal cues for safety     General Comments  VSS on RA, husband present            Home Living Family/patient expects to be discharged to:: Private residence Living Arrangements: Spouse/significant other Available Help at Discharge: Family Type of Home: House Home Access: Stairs to enter Technical brewer of Steps: 3 Entrance Stairs-Rails: None Home Layout: One level     Bathroom Shower/Tub: Teacher, early years/pre: Handicapped height     Home Equipment: Conservation officer, nature (2 wheels);Cane - single point          Prior Functioning/Environment Prior Level of Function : Independent/Modified Independent;Driving             Mobility Comments: no AD ADLs Comments: indep        OT Problem List: Decreased range of motion;Decreased activity tolerance;Decreased safety awareness;Decreased knowledge of precautions         OT Goals(Current goals can be found in the care plan section) Acute Rehab OT Goals Patient Stated Goal: home OT Goal Formulation: All assessment and education complete, DC therapy Potential to Achieve Goals: Good   AM-PAC OT "6 Clicks" Daily Activity     Outcome Measure Help from another person eating meals?: None Help from another person taking care of personal grooming?: A Little Help from another person toileting, which includes using toliet, bedpan, or urinal?: A Little Help from another person bathing (including washing, rinsing, drying)?: A Little Help from another person to put on and taking off regular upper body clothing?: A Little Help from another person to put on and taking off regular lower body clothing?: A Little 6 Click Score: 19   End of Session Equipment Utilized During Treatment: Cervical collar Nurse Communication: Mobility status  Activity Tolerance: Patient tolerated treatment well Patient  left: in bed;with call bell/phone within reach;with family/visitor present  OT Visit Diagnosis: Unsteadiness on feet (R26.81)                [id number] OT Time Calculation (min): 21 min Charges:  OT General Charges $OT Visit: 1 Visit OT Evaluation $OT Eval Low Complexity: 1 Low  Ruthellen Tippy A Rakayla Ricklefs 04/14/2021, 9:08 AM

## 2021-04-14 NOTE — Discharge Instructions (Signed)
Wound Care ?Leave incision open to air. ?You may shower. ?Do not scrub directly on incision.  ?Do not put any creams, lotions, or ointments on incision. ? ?Activity ?Walk each and every day, increasing distance each day. ?No lifting greater than 5 lbs.  Avoid excessive neck motion. ?No driving for 2 weeks; may ride as a passenger locally. ?Wear neck brace at all times except when showering.  If provided soft collar, may wear for comfort unless otherwise instructed. ? ?Diet ?Resume your normal diet.  ? ?Call Your Doctor If Any of These Occur ?Redness, drainage, or swelling at the wound.  ?Temperature greater than 101 degrees. ?Severe pain not relieved by pain medication. ?Increased difficulty swallowing. ?Incision starts to come apart. ? ?Follow Up Appt ?Call today for appointment in 3-4 weeks ([phone]) or for problems.  If you have any hardware placed in your spine, you will need an x-ray before your appointment.  ?

## 2021-04-14 NOTE — Discharge Summary (Signed)
Physician Discharge Summary  Patient ID: Lorraine Horton MRN: [id number] DOB/AGE: 63/07/1960 63 y.o.  Admit date: 04/13/2021 Discharge date: 04/14/2021  Admission Diagnoses:Cervical spondylosis with radiculopathy, C5-6, C6-7, C7-T1, right greater than left; mild cervical myelopathy  Discharge Diagnoses: Cervical spondylosis with radiculopathy, C5-6, C6-7, C7-T1, right greater than left; mild cervical myelopathy Principal Problem:   Cervical radiculopathy at C6   Discharged Condition: good  Hospital Course: The patient was admitted on 04/13/2021 and taken to the operating room where the patient underwent C5-T1 ACDF. The patient tolerated the procedure well and was taken to the recovery room and then to the floor in stable condition. The hospital course was routine. There were no complications. The wound remained clean dry and intact. Pt had appropriate upper back / surgical soreness. No complaints of arm pain or new N/T/W. The patient remained afebrile with stable vital signs, and tolerated a regular diet. The patient continued to increase activities, and pain was well controlled with oral pain medications.   Consults: None  Significant Diagnostic Studies: radiology: X-Ray: intraoperative   Treatments: surgery: 1. Arthrodesis C5-6, C6/7, C7-T1, anterior interbody technique  2. Placement of intervertebral allograft device, 6 x 14 mm at C5-6, 5 x 14 mm at C6-7, 6 x 14 mm at C7-T1 3. Placement of anterior instrumentation consisting of interbody plate and screws -K2 M Ozark plate, 57 mm; C5: 4.0 x 60 mm screws bilaterally, C6: left 4.5 x 14 mm, right 4.0 x 14 mm, C7: 4.0 x 14 mm bilaterally, T1: 4.0 x 14 mm bilaterally 4. Discectomy at C5-6, C6-7, C7-T1 for decompression of spinal cord and exiting nerve roots  5. Use of morselized bone allograft  6.  Use of autograft, same incision 7. Use of intraoperative microscope    Discharge Exam: Blood pressure (!) 103/56, pulse 79, temperature 97.6 F  (36.4 C), temperature source Oral, resp. rate 16, height 5\' 6"  (1.676 m), weight 111.6 kg, SpO2 96 %.  Physical Exam: Patient is awake, A/O X 4, conversant, and in good spirits. They are in NAD and VSS. Doing well. Speech is fluent and appropriate. MAEW with good strength that is symmetric bilaterally. 5/5 BUE/BLE. Sensation to light touch is intact. PERLA, EOMI. CNs grossly intact. Dressing is clean dry intact. Incision is well approximated with no drainage, erythema, or edema. Hard cervical collar in place      Disposition: Discharge disposition: 01-Home or Self Care       Discharge Instructions     Incentive spirometry RT   Complete by: As directed       Allergies as of 04/14/2021       Reactions   Bee Venom Shortness Of Breath, Swelling   Codeine Nausea And Vomiting, Other (See Comments)   REACTION: Passes out in addition to nausea and vomiting   Darvocet [propoxyphene N-acetaminophen] Nausea And Vomiting, Other (See Comments)   REACTION: passes out in addition to nausea and vomiting        Medication List     STOP taking these medications    tiZANidine 4 MG tablet Commonly known as: ZANAFLEX   traMADol 50 MG tablet Commonly known as: Ultram       TAKE these medications    atorvastatin 20 MG tablet Commonly known as: LIPITOR Take 20 mg by mouth daily.   buPROPion 150 MG 24 hr tablet Commonly known as: WELLBUTRIN XL Take 450 mg by mouth daily.   DULoxetine 60 MG capsule Commonly known as: CYMBALTA Take 60 mg by mouth  daily.   empagliflozin 10 MG Tabs tablet Commonly known as: JARDIANCE Take 10 mg by mouth daily.   FLUoxetine 40 MG capsule Commonly known as: PROZAC Take 80 mg by mouth daily.   gabapentin 800 MG tablet Commonly known as: NEURONTIN Take 800 mg by mouth 4 (four) times daily.   methocarbamol 500 MG tablet Commonly known as: Robaxin Take 1 tablet (500 mg total) by mouth 4 (four) times daily.   omeprazole 20 MG  capsule Commonly known as: PriLOSEC Take 1 capsule (20 mg total) by mouth daily. 30 days for gastroprotection while taking NSAIDs.   oxyCODONE-acetaminophen 5-325 MG tablet Commonly known as: Percocet Take 1-2 tablets by mouth every 6 (six) hours as needed for severe pain.   Potassium 99 MG Tabs Take 99 mg by mouth daily.   promethazine 12.5 MG tablet Commonly known as: PHENERGAN Take 1 tablet (12.5 mg total) by mouth every 6 (six) hours as needed for nausea or vomiting.         Signed: Council Mechanic, DNP, NP-C 04/14/2021, 8:41 AM

## 2021-04-14 NOTE — Evaluation (Signed)
Physical Therapy Evaluation & Discharge Patient Details Name: Lorraine Horton MRN: [id number] DOB: 20-Oct-1960 Today's Date: 04/14/2021  History of Present Illness  61 y/o female admitted on 04/13/21 following ACDF C5-T1. PMH: DM, HTN  Clinical Impression  Patient admitted following above procedure. Patient functioning at modI level for mobility with no AD. Educated patient on cervical precautions, brace wear, and progressive walking program, patient verbalized understanding. Patient able to negotiate 3 stairs to be able to access home environment. No further skilled PT needs identified acutely. No PT follow up recommended at this time.        Recommendations for follow up therapy are one component of a multi-disciplinary discharge planning process, led by the attending physician.  Recommendations may be updated based on patient status, additional functional criteria and insurance authorization.  Follow Up Recommendations No PT follow up    Assistance Recommended at Discharge Set up Supervision/Assistance  Patient can return home with the following       Equipment Recommendations None recommended by PT  Recommendations for Other Services       Functional Status Assessment Patient has not had a recent decline in their functional status     Precautions / Restrictions Precautions Precautions: Fall;Cervical Precaution Booklet Issued: Yes (comment) Precaution Comments: reviewed during ADLs and mobility Required Braces or Orthoses: Cervical Brace Cervical Brace: Hard collar;At all times Restrictions Weight Bearing Restrictions: No      Mobility  Bed Mobility               General bed mobility comments: sitting EOB on arrival    Transfers Overall transfer level: Modified independent                      Ambulation/Gait Ambulation/Gait assistance: Modified independent (Device/Increase time) Gait Distance (Feet): 200 Feet Assistive device: None Gait  Pattern/deviations: WFL(Within Functional Limits)   Gait velocity interpretation: >2.62 ft/sec, indicative of community ambulatory      Stairs Stairs: Yes Stairs assistance: Modified independent (Device/Increase time) Stair Management: No rails, Step to pattern, Forwards Number of Stairs: 3    Wheelchair Mobility    Modified Rankin (Stroke Patients Only)       Balance Overall balance assessment: Mild deficits observed, not formally tested                                           Pertinent Vitals/Pain Pain Assessment Pain Assessment: No/denies pain    Home Living Family/patient expects to be discharged to:: Private residence Living Arrangements: Spouse/significant other Available Help at Discharge: Family Type of Home: House Home Access: Stairs to enter Entrance Stairs-Rails: None Entrance Stairs-Number of Steps: 3   Home Layout: One level Home Equipment: Agricultural consultant (2 wheels);Cane - single point      Prior Function Prior Level of Function : Independent/Modified Independent;Driving             Mobility Comments: no AD ADLs Comments: indep     Hand Dominance        Extremity/Trunk Assessment   Upper Extremity Assessment Upper Extremity Assessment: Overall WFL for tasks assessed    Lower Extremity Assessment Lower Extremity Assessment: Overall WFL for tasks assessed    Cervical / Trunk Assessment Cervical / Trunk Assessment: Neck Surgery  Communication   Communication: No difficulties  Cognition Arousal/Alertness: Awake/alert Behavior During Therapy: WFL for tasks assessed/performed, Impulsive Overall  Cognitive Status: Within Functional Limits for tasks assessed                                 General Comments: able to recall 3/3 preacutions from OT session        General Comments General comments (skin integrity, edema, etc.): VSS on RA, husband present    Exercises     Assessment/Plan    PT  Assessment Patient does not need any further PT services  PT Problem List         PT Treatment Interventions      PT Goals (Current goals can be found in the Care Plan section)  Acute Rehab PT Goals Patient Stated Goal: to go home PT Goal Formulation: All assessment and education complete, DC therapy    Frequency       Co-evaluation               AM-PAC PT "6 Clicks" Mobility  Outcome Measure Help needed turning from your back to your side while in a flat bed without using bedrails?: None Help needed moving from lying on your back to sitting on the side of a flat bed without using bedrails?: None Help needed moving to and from a bed to a chair (including a wheelchair)?: None Help needed standing up from a chair using your arms (e.g., wheelchair or bedside chair)?: None Help needed to walk in hospital room?: None Help needed climbing 3-5 steps with a railing? : None 6 Click Score: 24    End of Session Equipment Utilized During Treatment: Cervical collar Activity Tolerance: Patient tolerated treatment well Patient left: in bed;with call bell/phone within reach Nurse Communication: Mobility status PT Visit Diagnosis: Muscle weakness (generalized) (M62.81)    Time: 1610-9604 PT Time Calculation (min) (ACUTE ONLY): 11 min   Charges:   PT Evaluation $PT Eval Low Complexity: 1 Low          Amarrion Pastorino A. Dan Humphreys PT, DPT Acute Rehabilitation Services Pager [phone] Office [phone]   Viviann Spare 04/14/2021, 9:08 AM

## 2021-04-14 NOTE — Progress Notes (Signed)
Patient awaiting transport via wheelchair by volunteer for discharge home; in no acute distress nor complaints of pain nor discomfort; incision on her neck with honeycomb dressing and is clean, dry and intact with Aspen collar on; room was checked and accounted for all her belongings; discharge instructions concerning her medications, incision care, follow up appointment and when to call the doctor as needed were all discussed with patient and husband by RN and both expressed understanding on the instructions given.

## 2021-04-14 NOTE — Plan of Care (Signed)

## 2021-07-02 ENCOUNTER — Other Ambulatory Visit: Payer: Self-pay | Admitting: Neurology

## 2021-07-02 DIAGNOSIS — R251 Tremor, unspecified: Secondary | ICD-10-CM

## 2021-07-16 ENCOUNTER — Ambulatory Visit
Admission: RE | Admit: 2021-07-16 | Discharge: 2021-07-16 | Disposition: A | Discharge status: Home / Self Care | Payer: Medicare HMO | Source: Ambulatory Visit | Attending: Neurology | Admitting: Neurology

## 2021-07-16 DIAGNOSIS — R251 Tremor, unspecified: Secondary | ICD-10-CM | POA: Insufficient documentation

## 2022-03-11 ENCOUNTER — Other Ambulatory Visit: Payer: Self-pay | Admitting: Student

## 2022-03-11 DIAGNOSIS — H02409 Unspecified ptosis of unspecified eyelid: Secondary | ICD-10-CM

## 2022-03-11 DIAGNOSIS — H02402 Unspecified ptosis of left eyelid: Secondary | ICD-10-CM

## 2022-03-21 ENCOUNTER — Ambulatory Visit
Admission: RE | Admit: 2022-03-21 | Discharge: 2022-03-21 | Disposition: A | Discharge status: Home / Self Care | Payer: Medicare HMO | Source: Ambulatory Visit | Attending: Student | Admitting: Student

## 2022-03-21 DIAGNOSIS — G43909 Migraine, unspecified, not intractable, without status migrainosus: Secondary | ICD-10-CM | POA: Diagnosis not present

## 2022-03-21 DIAGNOSIS — R251 Tremor, unspecified: Secondary | ICD-10-CM | POA: Diagnosis not present

## 2022-03-21 DIAGNOSIS — H02402 Unspecified ptosis of left eyelid: Secondary | ICD-10-CM | POA: Diagnosis present

## 2022-05-12 ENCOUNTER — Encounter: Payer: Self-pay | Admitting: Radiology

## 2022-11-21 ENCOUNTER — Encounter (HOSPITAL_COMMUNITY): Payer: Self-pay | Admitting: *Deleted

## 2022-11-21 ENCOUNTER — Emergency Department (HOSPITAL_COMMUNITY): Payer: Medicare HMO

## 2022-11-21 ENCOUNTER — Other Ambulatory Visit: Payer: Self-pay

## 2022-11-21 ENCOUNTER — Emergency Department (HOSPITAL_COMMUNITY)
Admission: EM | Admit: 2022-11-21 | Discharge: 2022-11-21 | Disposition: A | Discharge status: Home / Self Care | Payer: Medicare HMO | Attending: Emergency Medicine | Admitting: Emergency Medicine

## 2022-11-21 DIAGNOSIS — Z96611 Presence of right artificial shoulder joint: Secondary | ICD-10-CM | POA: Diagnosis not present

## 2022-11-21 DIAGNOSIS — Z7984 Long term (current) use of oral hypoglycemic drugs: Secondary | ICD-10-CM | POA: Diagnosis not present

## 2022-11-21 DIAGNOSIS — X58XXXA Exposure to other specified factors, initial encounter: Secondary | ICD-10-CM | POA: Insufficient documentation

## 2022-11-21 DIAGNOSIS — T84028A Dislocation of other internal joint prosthesis, initial encounter: Secondary | ICD-10-CM | POA: Diagnosis not present

## 2022-11-21 DIAGNOSIS — S43004A Unspecified dislocation of right shoulder joint, initial encounter: Secondary | ICD-10-CM

## 2022-11-21 DIAGNOSIS — S4991XA Unspecified injury of right shoulder and upper arm, initial encounter: Secondary | ICD-10-CM | POA: Diagnosis present

## 2022-11-21 MED ORDER — FENTANYL CITRATE PF 50 MCG/ML IJ SOSY
50.0000 ug | PREFILLED_SYRINGE | Freq: Once | INTRAMUSCULAR | Status: DC
Start: 2022-11-21 — End: 2022-11-21
  Filled 2022-11-21: qty 1

## 2022-11-21 MED ORDER — PROPOFOL 10 MG/ML IV BOLUS
0.5000 mg/kg | Freq: Once | INTRAVENOUS | Status: AC
Start: 2022-11-21 — End: 2022-11-21
  Administered 2022-11-21: 57.4 mg via INTRAVENOUS
  Filled 2022-11-21: qty 20

## 2022-11-21 MED ORDER — MIDAZOLAM HCL 5 MG/5ML IJ SOLN
1.0000 mg | Freq: Once | INTRAMUSCULAR | Status: DC
  Filled 2022-11-21: qty 5

## 2022-11-21 NOTE — Progress Notes (Signed)
RT present for conscious sedation for shoulder dislocation. VSS throughout. 2 lpm nasal cannula applied and ETCO2 monitor. Patient tolerated well. No adverse reactions noted.

## 2022-11-21 NOTE — ED Triage Notes (Signed)
Pt with right shoulder surgery about 2 years ago, pt went to pick up a bowl of spaghetti and right shoulder dislocated about 2 hours ago.  Pt c/o numbness to to right shoulder and hand

## 2022-11-21 NOTE — ED Provider Notes (Signed)
[place] EMERGENCY DEPARTMENT AT [facility] Provider Note   CSN: [id number] Arrival date & time: 11/21/22  1722     History {Add pertinent medical, surgical, social history, OB history to HPI:1} Chief Complaint  Patient presents with   Shoulder Pain    Lorraine Horton is a 62 y.o. female.  This is a 61 year old female who presents emergency department today due to dislocation of the right shoulder.  Patient had shoulder replacement 2 years ago.  She was reaching for a bowl of spaghetti, and her shoulder dislocated.   Shoulder Pain      Home Medications Prior to Admission medications   Medication Sig Start Date End Date Taking? Authorizing Provider  atorvastatin (LIPITOR) 20 MG tablet Take 20 mg by mouth daily. 04/27/18   [provider]  buPROPion (WELLBUTRIN XL) 150 MG 24 hr tablet Take 450 mg by mouth daily. 05/05/18   [provider]  DULoxetine (CYMBALTA) 60 MG capsule Take 60 mg by mouth daily. 10/25/19   [provider]  empagliflozin (JARDIANCE) 10 MG TABS tablet Take 10 mg by mouth daily.    [provider]  FLUoxetine (PROZAC) 40 MG capsule Take 80 mg by mouth daily. 04/06/18   [provider]  gabapentin (NEURONTIN) 800 MG tablet Take 800 mg by mouth 4 (four) times daily. 05/09/18   [provider]  methocarbamol (ROBAXIN) 500 MG tablet Take 1 tablet (500 mg total) by mouth 4 (four) times daily. 04/14/21   Council Mechanic, NP  omeprazole (PRILOSEC) 20 MG capsule Take 1 capsule (20 mg total) by mouth daily. 30 days for gastroprotection while taking NSAIDs. Patient not taking: Reported on 04/02/2021 05/13/20   Lorraine Horton  Potassium 99 MG TABS Take 99 mg by mouth daily.    [provider]  promethazine (PHENERGAN) 12.5 MG tablet Take 1 tablet (12.5 mg total) by mouth every 6 (six) hours as needed for nausea or vomiting. Patient not taking: Reported on 04/02/2021 05/13/20   Vernetta Honey,  PA-C      Allergies    Bee venom, Codeine, and Darvocet [propoxyphene n-acetaminophen]    Review of Systems   Review of Systems  Physical Exam Updated Vital Signs BP (!) 145/73 (BP Location: Left Arm)   Pulse 69   Temp 98 F (36.7 C) (Oral)   Resp 18   Ht 5\' 6"  (1.676 m)   Wt 114.8 kg   LMP  (LMP Unknown) Comment: BTL  SpO2 98%   BMI 40.84 kg/m  Physical Exam Vitals and nursing note reviewed.  Musculoskeletal:     Comments: Squared off right shoulder.   Neurological:     Mental Status: She is alert.     Comments: Intact median, radial, ulnar nerve sensation and function     ED Results / Procedures / Treatments   Labs (all labs ordered are listed, but only abnormal results are displayed) Labs Reviewed - No data to display  EKG None  Radiology No results found.  Procedures .Sedation  Date/Time: 11/21/2022 9:03 PM  Performed by: Arletha Pili, DO Authorized by: Arletha Pili, DO   Consent:    Consent obtained:  Written   Consent given by:  Patient   Risks discussed:  Prolonged hypoxia resulting in organ damage and prolonged sedation necessitating reversal   Alternatives discussed:  Analgesia without sedation Indications:    Procedure performed:  Dislocation reduction   Procedure necessitating sedation performed by:  Physician performing  sedation Pre-sedation assessment:    Time since last food or drink:  2 hours   ASA classification: class 2 - patient with mild systemic disease     Mouth opening:  3 or more finger widths   Thyromental distance:  4 finger widths   Mallampati score:  II - soft palate, uvula, fauces visible   Neck mobility: normal     Pre-sedation assessments completed and reviewed: airway patency, cardiovascular function, hydration status, mental status, nausea/vomiting, pain level, respiratory function and temperature     Pre-sedation assessment completed:  11/21/2022 8:04 PM Immediate pre-procedure details:    Reassessment: Patient  reassessed immediately prior to procedure     Reviewed: vital signs     Verified: bag valve mask available, emergency equipment available, intubation equipment available, IV patency confirmed, oxygen available, reversal medications available and suction available   Procedure details (see MAR for exact dosages):    Preoxygenation:  Nasal cannula   Sedation:  Propofol   Intended level of sedation: deep   Analgesia:  None   Intra-procedure monitoring:  Blood pressure monitoring, continuous capnometry and frequent vital sign checks   Intra-procedure events: none     Total Provider sedation time (minutes):  20 Post-procedure details:    Post-sedation assessment completed:  11/21/2022 9:05 PM   Attendance: Constant attendance by certified staff until patient recovered     Recovery: Patient returned to pre-procedure baseline     Patient is stable for discharge or admission: yes     Procedure completion:  Tolerated well, no immediate complications Reduction of dislocation  Date/Time: 11/21/2022 9:05 PM  Performed by: Arletha Pili, DO Authorized by: Arletha Pili, DO  Consent: Verbal consent obtained. Consent given by: patient Patient understanding: patient states understanding of the procedure being performed Patient consent: the patient's understanding of the procedure matches consent given Procedure consent: procedure consent matches procedure scheduled Relevant documents: relevant documents present and verified Test results: test results available and properly labeled Imaging studies: imaging studies available Patient identity confirmed: verbally with patient and arm band Local anesthesia used: no  Anesthesia: Local anesthesia used: no  Sedation: Patient sedated: yes Sedatives: propofol  Comments: Production obtained with traction and abduction on the right shoulder.  Audible clunk.  Confirmed on x-ray.  Tolerated procedure well.     {Document cardiac monitor, telemetry  assessment procedure when appropriate:1}  Medications Ordered in ED Medications  fentaNYL (SUBLIMAZE) injection 50 mcg (has no administration in time range)  midazolam (VERSED) 5 MG/5ML injection 1 mg (has no administration in time range)    ED Course/ Medical Decision Making/ A&P   {   Click here for ABCD2, HEART and other calculatorsREFRESH Note before signing :1}                              Medical Decision Making This is a 62 year old female is here today for dislocated shoulder.  Plan-will provide some analgesia for the patient to see if we can reduce dislocated shoulder without procedural sedation.  Reassessment-unfortunately, patient could not tolerate reduction without sedation.  Sedation performed without complication.  Confirmed on repeat x-ray.  Discharged with orthopedic follow-up.  Amount and/or Complexity of Data Reviewed Radiology: ordered.  Risk Prescription drug management.   ***  {Document critical care time when appropriate:1} {Document review of labs and clinical decision tools ie heart score, Chads2Vasc2 etc:1}  {Document your independent review of radiology images, and any outside records:1} {  Document your discussion with family members, caretakers, and with consultants:1} {Document social determinants of health affecting pt's care:1} {Document your decision making why or why not admission, treatments were needed:1} Final Clinical Impression(s) / ED Diagnoses Final diagnoses:  None    Rx / DC Orders ED Discharge Orders     None

## 2022-11-21 NOTE — Discharge Instructions (Addendum)
Your shoulder in a sling until you follow-up with orthopedic surgery.  I would like you to call the surgeon who performed your surgery within 1 week for a follow-up appointment.  If you do not have an orthopedic surgeon, I have included one in your discharge paperwork.

## 2022-11-21 NOTE — Sedation Documentation (Signed)
Vital signs stable. 

## 2022-11-21 NOTE — Sedation Documentation (Signed)
Patient is resting comfortably. 

## 2022-11-21 NOTE — ED Notes (Signed)
Portable xray at bedside.

## 2023-03-10 ENCOUNTER — Emergency Department (HOSPITAL_COMMUNITY)
Admission: EM | Admit: 2023-03-10 | Discharge: 2023-03-10 | Disposition: A | Discharge status: Home / Self Care | Payer: Medicare HMO | Attending: Emergency Medicine | Admitting: Emergency Medicine

## 2023-03-10 ENCOUNTER — Other Ambulatory Visit: Payer: Self-pay

## 2023-03-10 ENCOUNTER — Emergency Department (HOSPITAL_COMMUNITY): Payer: Medicare HMO

## 2023-03-10 ENCOUNTER — Encounter (HOSPITAL_COMMUNITY): Payer: Self-pay | Admitting: *Deleted

## 2023-03-10 DIAGNOSIS — S43014A Anterior dislocation of right humerus, initial encounter: Secondary | ICD-10-CM | POA: Insufficient documentation

## 2023-03-10 DIAGNOSIS — S4991XA Unspecified injury of right shoulder and upper arm, initial encounter: Secondary | ICD-10-CM | POA: Diagnosis present

## 2023-03-10 DIAGNOSIS — X501XXA Overexertion from prolonged static or awkward postures, initial encounter: Secondary | ICD-10-CM | POA: Diagnosis not present

## 2023-03-10 MED ORDER — PROPOFOL 10 MG/ML IV BOLUS
INTRAVENOUS | Status: AC | PRN
  Administered 2023-03-10: 67.5 ug via INTRAVENOUS

## 2023-03-10 MED ORDER — PROPOFOL 10 MG/ML IV BOLUS
0.5000 mg/kg | Freq: Once | INTRAVENOUS | Status: DC
  Filled 2023-03-10: qty 20

## 2023-03-10 NOTE — Discharge Instructions (Signed)
You were seen in the emergency department for right shoulder dislocation.  Your shoulder was reduced and you were placed in a sling.  Please keep this on until you follow-up with your orthopedic doctor.  Tylenol as needed for pain.  Return to the emergency department if any worsening or concerning symptoms.

## 2023-03-10 NOTE — ED Triage Notes (Signed)
Pt right shoulder pain, pt believes it's dislocated after lifting a platter of food to put up. Not able to lift arm.  Pt states she had surgery on same surgery about 3 years ago. C/o numbness to right hand, good strong radial pulse noted to right

## 2023-03-10 NOTE — ED Provider Notes (Signed)
[place] EMERGENCY DEPARTMENT AT [facility] Provider Note   CSN: [id number] Arrival date & time: 03/10/23  1725     History {Add pertinent medical, surgical, social history, OB history to HPI:1} Chief Complaint  Patient presents with   Shoulder Pain    Lorraine Horton is a 62 y.o. female.  She is here with a complaint of acute right shoulder pain after she lifted up a dinner platter around 5 PM tonight.  She thinks she may have dislocated her shoulder again.  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  Her surgery was done by Dr. Everardo Pacific.  She is complaining of some decreased sensation in that right arm.  No other injuries or complaints.  The history is provided by the patient.  Shoulder Pain Location:  Shoulder Shoulder location:  R shoulder Injury: yes   Time since incident:  2 hours Mechanism of injury comment:  Lifting Pain details:    Quality:  Throbbing   Severity:  Severe   Onset quality:  Sudden   Timing:  Constant   Progression:  Unchanged Handedness:  Right-handed Dislocation: yes   Prior injury to area:  Yes Relieved by:  None tried Worsened by:  Movement Ineffective treatments:  None tried Associated symptoms: decreased range of motion and numbness   Associated symptoms: no fever        Home Medications Prior to Admission medications   Medication Sig Start Date End Date Taking? Authorizing Provider  atorvastatin (LIPITOR) 20 MG tablet Take 20 mg by mouth daily. 04/27/18   [provider]  buPROPion (WELLBUTRIN XL) 150 MG 24 hr tablet Take 450 mg by mouth daily. 05/05/18   [provider]  DULoxetine (CYMBALTA) 60 MG capsule Take 60 mg by mouth daily. 10/25/19   [provider]  empagliflozin (JARDIANCE) 10 MG TABS tablet Take 10 mg by mouth daily.    [provider]  FLUoxetine (PROZAC) 40 MG capsule Take 80 mg by mouth daily. 04/06/18   [provider]  gabapentin  (NEURONTIN) 800 MG tablet Take 800 mg by mouth 4 (four) times daily. 05/09/18   [provider]  methocarbamol (ROBAXIN) 500 MG tablet Take 1 tablet (500 mg total) by mouth 4 (four) times daily. 04/14/21   Council Mechanic, NP  omeprazole (PRILOSEC) 20 MG capsule Take 1 capsule (20 mg total) by mouth daily. 30 days for gastroprotection while taking NSAIDs. Patient not taking: Reported on 04/02/2021 05/13/20   Zella Ball  Potassium 99 MG TABS Take 99 mg by mouth daily.    [provider]  promethazine (PHENERGAN) 12.5 MG tablet Take 1 tablet (12.5 mg total) by mouth every 6 (six) hours as needed for nausea or vomiting. Patient not taking: Reported on 04/02/2021 05/13/20   Vernetta Honey, PA-C      Allergies    Bee venom, Codeine, and Darvocet [propoxyphene n-acetaminophen]    Review of Systems   Review of Systems  Constitutional:  Negative for fever.  Respiratory:  Negative for shortness of breath.   Cardiovascular:  Negative for chest pain.  Neurological:  Positive for numbness. Negative for weakness.    Physical Exam Updated Vital Signs BP 121/63   Pulse 80   Temp 98.2 F (36.8 C) (Oral)   Resp 15   Ht 5\' 6"  (1.676 m)   Wt 123.4 kg   LMP  (LMP Unknown) Comment: BTL  SpO2 99%   BMI  43.90 kg/m  Physical Exam Vitals and nursing note reviewed.  Constitutional:      General: She is not in acute distress.    Appearance: Normal appearance. She is well-developed.  HENT:     Head: Normocephalic and atraumatic.  Eyes:     Conjunctiva/sclera: Conjunctivae normal.  Cardiovascular:     Rate and Rhythm: Normal rate and regular rhythm.     Heart sounds: No murmur heard. Pulmonary:     Effort: Pulmonary effort is normal. No respiratory distress.     Breath sounds: Normal breath sounds.  Abdominal:     Palpations: Abdomen is soft.     Tenderness: There is no abdominal tenderness.  Musculoskeletal:        General: Tenderness present.     Cervical back:  Neck supple.     Comments: She is diffusely tender around her right shoulder and has limited range of motion secondary to pain.  Elbow and wrist nontender.  Radial pulse 2+ strong.  She has subjective decreased sensation of her right arm.  Skin:    General: Skin is warm and dry.     Capillary Refill: Capillary refill takes less than 2 seconds.  Neurological:     Mental Status: She is alert.  Psychiatric:        Mood and Affect: Mood normal.     ED Results / Procedures / Treatments   Labs (all labs ordered are listed, but only abnormal results are displayed) Labs Reviewed - No data to display  EKG None  Radiology DG Shoulder Right Result Date: 03/10/2023 CLINICAL DATA:  Right shoulder pain.  Concern for dislocation. EXAM: RIGHT SHOULDER - 2+ VIEW COMPARISON:  Radiograph dated 11/21/2022. 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 There is anterior dislocation of the arthroplasty. Fluid acute fracture. The bones are osteopenic. The soft tissues are unremarkable. IMPRESSION: Anterior dislocation of the right shoulder arthroplasty. Electronically Signed   By: Elgie Collard M.D.   On: 03/10/2023 18:37    Procedures .Sedation  Date/Time: 03/10/2023 8:37 PM  Performed by: Terrilee Files, MD Authorized by: Terrilee Files, MD   Consent:    Consent obtained:  Written   Consent given by:  Patient   Risks discussed:  Dysrhythmia, inadequate sedation, nausea, vomiting, respiratory compromise necessitating ventilatory assistance and intubation and prolonged hypoxia resulting in organ damage   Alternatives discussed:  Analgesia without sedation Universal protocol:    Procedure explained and questions answered to patient or proxy's satisfaction: yes     Immediately prior to procedure, a time out was called: yes     Patient identity confirmed:  Arm band Indications:    Procedure necessitating sedation performed by:  Physician performing sedation Pre-sedation  assessment:    Time since last food or drink:  3   ASA classification: class 3 - patient with severe systemic disease     Mouth opening:  3 or more finger widths   Thyromental distance:  3 finger widths   Mallampati score:  III - soft palate, base of uvula visible   Neck mobility: normal     Pre-sedation assessments completed and reviewed: airway patency, cardiovascular function, hydration status, mental status, nausea/vomiting, pain level, respiratory function and temperature   Immediate pre-procedure details:    Reassessment: Patient reassessed immediately prior to procedure     Reviewed: vital signs, relevant labs/tests and NPO status     Verified: bag valve mask available, emergency equipment available, intubation equipment available, IV patency confirmed, oxygen  available and suction available   Procedure details (see MAR for exact dosages):    Preoxygenation:  Nonrebreather mask   Sedation:  Propofol   Intended level of sedation: deep   Intra-procedure monitoring:  Blood pressure monitoring, cardiac monitor, continuous pulse oximetry, continuous capnometry, frequent LOC assessments and frequent vital sign checks   Intra-procedure events: none     Total Provider sedation time (minutes):  15 Post-procedure details:   A post-sedation assessment was completed following the completion of the procedure.   Attendance: Constant attendance by certified staff until patient recovered     Recovery: Patient returned to pre-procedure baseline     Post-sedation assessments completed and reviewed: airway patency, cardiovascular function, hydration status, mental status, nausea/vomiting, pain level, respiratory function and temperature     Patient is stable for discharge or admission: yes     Procedure completion:  Tolerated well, no immediate complications .Reduction of dislocation  Date/Time: 03/10/2023 8:38 PM  Performed by: Terrilee Files, MD Authorized by: Terrilee Files, MD  Consent:  Written consent obtained. Consent given by: patient Patient understanding: patient states understanding of the procedure being performed Patient consent: the patient's understanding of the procedure matches consent given Procedure consent: procedure consent matches procedure scheduled Patient identity confirmed: arm band Time out: Immediately prior to procedure a "time out" was called to verify the correct patient, procedure, equipment, support staff and site/side marked as required. Local anesthesia used: no  Anesthesia: Local anesthesia used: no  Sedation: Patient sedated: yes  Patient tolerance: patient tolerated the procedure well with no immediate complications Comments: Patient was reduced with traction countertraction.  Placed in shoulder immobilizer.  Postreduction films ordered     {Document cardiac monitor, telemetry assessment procedure when appropriate:1}  Medications Ordered in ED Medications  propofol (DIPRIVAN) 10 mg/mL bolus/IV push 61.7 mg (has no administration in time range)    ED Course/ Medical Decision Making/ A&P   {   Click here for ABCD2, HEART and other calculatorsREFRESH Note before signing :1}                              Medical Decision Making Amount and/or Complexity of Data Reviewed Radiology: ordered.   ***  {Document critical care time when appropriate:1} {Document review of labs and clinical decision tools ie heart score, Chads2Vasc2 etc:1}  {Document your independent review of radiology images, and any outside records:1} {Document your discussion with family members, caretakers, and with consultants:1} {Document social determinants of health affecting pt's care:1} {Document your decision making why or why not admission, treatments were needed:1} Final Clinical Impression(s) / ED Diagnoses Final diagnoses:  None    Rx / DC Orders ED Discharge Orders     None

## 2023-03-13 ENCOUNTER — Other Ambulatory Visit: Payer: Self-pay | Admitting: Orthopaedic Surgery

## 2023-03-13 DIAGNOSIS — T07XXXA Unspecified multiple injuries, initial encounter: Secondary | ICD-10-CM

## 2023-03-13 DIAGNOSIS — S43004A Unspecified dislocation of right shoulder joint, initial encounter: Secondary | ICD-10-CM

## 2023-03-21 ENCOUNTER — Ambulatory Visit
Admission: RE | Admit: 2023-03-21 | Discharge: 2023-03-21 | Disposition: A | Discharge status: Home / Self Care | Payer: Medicare HMO | Source: Ambulatory Visit | Attending: Orthopaedic Surgery

## 2023-03-21 DIAGNOSIS — T07XXXA Unspecified multiple injuries, initial encounter: Secondary | ICD-10-CM

## 2023-03-21 DIAGNOSIS — S43004A Unspecified dislocation of right shoulder joint, initial encounter: Secondary | ICD-10-CM

## 2023-05-02 NOTE — Progress Notes (Signed)
Interview completed over the phone. Coming in for labs  and instructions 05/05/23 at 1400. Pharmacy will call patient at 1040 today to complete med rec.  COVID Vaccine Completed: no  Date of COVID positive in last 90 days: no  PCP - Terie Purser, PA Cardiologist - Neurologist-  Janice Coffin, MD LOV 05/02/22 for migraines   Chest x-ray - n/a EKG - need when pt comes in for labs Stress Test - n/a ECHO - n/a Cardiac Cath - n/a Pacemaker/ICD device last checked: n/a Spinal Cord Stimulator:n/a  Bowel Prep - no  Sleep Study - n/a CPAP -   Fasting Blood Sugar -  Checks Blood Sugar  not currently checking  Last dose of GLP1 agonist-  mounjaro, takes Sundays GLP1 instructions:  Hold 7 days before surgery. Do not take 05/07/23. Last dose 04/30/23   Last dose of SGLT-2 inhibitors-  Jardiance  SGLT-2 instructions:  Hold 3 days before surgery. Last dose 05/06/23   Blood Thinner Instructions:  Last dose: n/a  Time: Aspirin Instructions: Last Dose:  Activity level: Can perform activities of daily living without stopping and without symptoms of chest pain or shortness of breath. No stairs due to knee pain.  Anesthesia review:   Patient denies shortness of breath, fever, cough and chest pain at PAT appointment  Patient verbalized understanding of instructions that were given to them at the PAT appointment. Patient was also instructed that they will need to review over the PAT instructions again at home before surgery.

## 2023-05-02 NOTE — Patient Instructions (Signed)
SURGICAL WAITING ROOM VISITATION  Patients having surgery or a procedure may have no more than 2 support people in the waiting area - these visitors may rotate.    Children under the age of 13 must have an adult with them who is not the patient.  Due to an increase in RSV and influenza rates and associated hospitalizations, children ages 77 and under may not visit patients in [facility] hospitals.  Visitors with respiratory illnesses are discouraged from visiting and should remain at home.  If the patient needs to stay at the hospital during part of their recovery, the visitor guidelines for inpatient rooms apply. Pre-op nurse will coordinate an appropriate time for 1 support person to accompany patient in pre-op.  This support person may not rotate.    Please refer to the [facility] website for the visitor guidelines for Inpatients (after your surgery is over and you are in a regular room).    Your procedure is scheduled on: 05/10/23   Report to [facility] Main Entrance    Report to admitting at 10:00 AM   Call this number if you have problems the morning of surgery [phone]   Do not eat food or drink liquids :After Midnight.          If you have questions, please contact your surgeon's office.   FOLLOW BOWEL PREP AND ANY ADDITIONAL PRE OP INSTRUCTIONS YOU RECEIVED FROM YOUR SURGEON'S OFFICE!!!     Oral Hygiene is also important to reduce your risk of infection.                                    Remember - BRUSH YOUR TEETH THE MORNING OF SURGERY WITH YOUR REGULAR TOOTHPASTE  DENTURES WILL BE REMOVED PRIOR TO SURGERY PLEASE DO NOT APPLY "Poly grip" OR ADHESIVES!!!   Stop all vitamins and herbal supplements 7 days before surgery.   Take these medicines the morning of surgery with A SIP OF WATER: Atorvastatin, Bupropion, Duloxetine, Fluoxetine, Gabapentin   DO NOT TAKE ANY ORAL DIABETIC MEDICATIONS DAY OF YOUR SURGERY  How to Manage Your Diabetes Before and  After Surgery  Why is it important to control my blood sugar before and after surgery? Improving blood sugar levels before and after surgery helps healing and can limit problems. A way of improving blood sugar control is eating a healthy diet by:  Eating less sugar and carbohydrates  Increasing activity/exercise  Talking with your doctor about reaching your blood sugar goals High blood sugars (greater than 180 mg/dL) can raise your risk of infections and slow your recovery, so you will need to focus on controlling your diabetes during the weeks before surgery. Make sure that the doctor who takes care of your diabetes knows about your planned surgery including the date and location.  How do I manage my blood sugar before surgery? Check your blood sugar at least 4 times a day, starting 2 days before surgery, to make sure that the level is not too high or low. Check your blood sugar the morning of your surgery when you wake up and every 2 hours until you get to the Short Stay unit. If your blood sugar is less than 70 mg/dL, you will need to treat for low blood sugar: Do not take insulin. Treat a low blood sugar (less than 70 mg/dL) with  cup of clear juice (cranberry or apple), 4 glucose tablets,  OR glucose gel. Recheck blood sugar in 15 minutes after treatment (to make sure it is greater than 70 mg/dL). If your blood sugar is not greater than 70 mg/dL on recheck, call [phone] for further instructions. Report your blood sugar to the short stay nurse when you get to Short Stay.  If you are admitted to the hospital after surgery: Your blood sugar will be checked by the staff and you will probably be given insulin after surgery (instead of oral diabetes medicines) to make sure you have good blood sugar levels. The goal for blood sugar control after surgery is 80-180 mg/dL.   WHAT DO I DO ABOUT MY DIABETES MEDICATION?  Do not take oral diabetes medicines (pills) the morning of  surgery.  Hold Jardiance for 3 days prior. Last dose 05/06/23  Hold Mounjaro for 7 days. Last dose 04/30/23, do not take 05/07/23  Reviewed and Endorsed by [facility] Patient Education Committee, August 2015                              You may not have any metal on your body including hair pins, jewelry, and body piercing             Do not wear make-up, lotions, powders, perfumes, or deodorant  Do not wear nail polish including gel and S&S, artificial/acrylic nails, or any other type of covering on natural nails including finger and toenails. If you have artificial nails, gel coating, etc. that needs to be removed by a nail salon please have this removed prior to surgery or surgery may need to be canceled/ delayed if the surgeon/ anesthesia feels like they are unable to be safely monitored.   Do not shave  48 hours prior to surgery.    Do not bring valuables to the hospital. [facility] IS NOT             RESPONSIBLE   FOR VALUABLES.   Contacts, glasses, dentures or bridgework may not be worn into surgery.   Bring small overnight bag day of surgery.   DO NOT BRING YOUR HOME MEDICATIONS TO THE HOSPITAL. PHARMACY WILL DISPENSE MEDICATIONS LISTED ON YOUR MEDICATION LIST TO YOU DURING YOUR ADMISSION IN THE HOSPITAL!              Please read over the following fact sheets you were given: IF YOU HAVE QUESTIONS ABOUT YOUR PRE-OP INSTRUCTIONS PLEASE CALL [phone] [place]    If you received a COVID test during your pre-op visit  it is requested that you wear a mask when out in public, stay away from anyone that may not be feeling well and notify your surgeon if you develop symptoms. If you test positive for Covid or have been in contact with anyone that has tested positive in the last 10 days please notify you surgeon.      Pre-operative 5 CHG Bath Instructions   You can play a key role in reducing the risk of infection after surgery. Your skin needs to be as free of germs as possible.  You can reduce the number of germs on your skin by washing with CHG (chlorhexidine gluconate) soap before surgery. CHG is an antiseptic soap that kills germs and continues to kill germs even after washing.   DO NOT use if you have an allergy to chlorhexidine/CHG or antibacterial soaps. If your skin becomes reddened or irritated, stop using the CHG and notify one of our  RNs at [phone].   Please shower with the CHG soap starting 4 days before surgery using the following schedule:     Please keep in mind the following:  DO NOT shave, including legs and underarms, starting the day of your first shower.   You may shave your face at any point before/day of surgery.  Place clean sheets on your bed the day you start using CHG soap. Use a clean washcloth (not used since being washed) for each shower. DO NOT sleep with pets once you start using the CHG.   CHG Shower Instructions:  If you choose to wash your hair and private area, wash first with your normal shampoo/soap.  After you use shampoo/soap, rinse your hair and body thoroughly to remove shampoo/soap residue.  Turn the water OFF and apply about 3 tablespoons (45 ml) of CHG soap to a CLEAN washcloth.  Apply CHG soap ONLY FROM YOUR NECK DOWN TO YOUR TOES (washing for 3-5 minutes)  DO NOT use CHG soap on face, private areas, open wounds, or sores.  Pay special attention to the area where your surgery is being performed.  If you are having back surgery, having someone wash your back for you may be helpful. Wait 2 minutes after CHG soap is applied, then you may rinse off the CHG soap.  Pat dry with a clean towel  Put on clean clothes/pajamas   If you choose to wear lotion, please use ONLY the CHG-compatible lotions on the back of this paper.     Additional instructions for the day of surgery: DO NOT APPLY any lotions, deodorants, cologne, or perfumes.   Put on clean/comfortable clothes.  Brush your teeth.  Ask your nurse before applying  any prescription medications to the skin.      CHG Compatible Lotions   Aveeno Moisturizing lotion  Cetaphil Moisturizing Cream  Cetaphil Moisturizing Lotion  Clairol Herbal Essence Moisturizing Lotion, Dry Skin  Clairol Herbal Essence Moisturizing Lotion, Extra Dry Skin  Clairol Herbal Essence Moisturizing Lotion, Normal Skin  Curel Age Defying Therapeutic Moisturizing Lotion with Alpha Hydroxy  Curel Extreme Care Body Lotion  Curel Soothing Hands Moisturizing Hand Lotion  Curel Therapeutic Moisturizing Cream, Fragrance-Free  Curel Therapeutic Moisturizing Lotion, Fragrance-Free  Curel Therapeutic Moisturizing Lotion, Original Formula  Eucerin Daily Replenishing Lotion  Eucerin Dry Skin Therapy Plus Alpha Hydroxy Crme  Eucerin Dry Skin Therapy Plus Alpha Hydroxy Lotion  Eucerin Original Crme  Eucerin Original Lotion  Eucerin Plus Crme Eucerin Plus Lotion  Eucerin TriLipid Replenishing Lotion  Keri Anti-Bacterial Hand Lotion  Keri Deep Conditioning Original Lotion Dry Skin Formula Softly Scented  Keri Deep Conditioning Original Lotion, Fragrance Free Sensitive Skin Formula  Keri Lotion Fast Absorbing Fragrance Free Sensitive Skin Formula  Keri Lotion Fast Absorbing Softly Scented Dry Skin Formula  Keri Original Lotion  Keri Skin Renewal Lotion Keri Silky Smooth Lotion  Keri Silky Smooth Sensitive Skin Lotion  Nivea Body Creamy Conditioning Oil  Nivea Body Extra Enriched Lotion  Nivea Body Original Lotion  Nivea Body Sheer Moisturizing Lotion Nivea Crme  Nivea Skin Firming Lotion  NutraDerm 30 Skin Lotion  NutraDerm Skin Lotion  NutraDerm Therapeutic Skin Cream  NutraDerm Therapeutic Skin Lotion  ProShield Protective Hand Cream  Provon moisturizing lotion View Pre-Surgery Education Videos:  [email or web address]     Incentive Spirometer  An incentive spirometer is a tool that can help keep your lungs  clear and active. This tool measures how well you are filling your lungs with each breath.  Taking long deep breaths may help reverse or decrease the chance of developing breathing (pulmonary) problems (especially infection) following: A long period of time when you are unable to move or be active. BEFORE THE PROCEDURE  If the spirometer includes an indicator to show your best effort, your nurse or respiratory therapist will set it to a desired goal. If possible, sit up straight or lean slightly forward. Try not to slouch. Hold the incentive spirometer in an upright position. INSTRUCTIONS FOR USE  Sit on the edge of your bed if possible, or sit up as far as you can in bed or on a chair. Hold the incentive spirometer in an upright position. Breathe out normally. Place the mouthpiece in your mouth and seal your lips tightly around it. Breathe in slowly and as deeply as possible, raising the piston or the ball toward the top of the column. Hold your breath for 3-5 seconds or for as long as possible. Allow the piston or ball to fall to the bottom of the column. Remove the mouthpiece from your mouth and breathe out normally. Rest for a few seconds and repeat Steps 1 through 7 at least 10 times every 1-2 hours when you are awake. Take your time and take a few normal breaths between deep breaths. The spirometer may include an indicator to show your best effort. Use the indicator as a goal to work toward during each repetition. After each set of 10 deep breaths, practice coughing to be sure your lungs are clear. If you have an incision (the cut made at the time of surgery), support your incision when coughing by placing a pillow or rolled up towels firmly against it. Once you are able to get out of bed, walk around indoors and cough well. You may stop using the incentive spirometer when instructed by your caregiver.  RISKS AND COMPLICATIONS Take your time so you do not get dizzy or light-headed. If you  are in pain, you may need to take or ask for pain medication before doing incentive spirometry. It is harder to take a deep breath if you are having pain. AFTER USE Rest and breathe slowly and easily. It can be helpful to keep track of a log of your progress. Your caregiver can provide you with a simple table to help with this. If you are using the spirometer at home, follow these instructions: SEEK MEDICAL CARE IF:  You are having difficultly using the spirometer. You have trouble using the spirometer as often as instructed. Your pain medication is not giving enough relief while using the spirometer. You develop fever of 100.5 F (38.1 C) or higher. SEEK IMMEDIATE MEDICAL CARE IF:  You cough up bloody sputum that had not been present before. You develop fever of 102 F (38.9 C) or greater. You develop worsening pain at or near the incision site. MAKE SURE YOU:  Understand these instructions. Will watch your condition. Will get help right away if you are not doing well or get worse. Document Released: 07/11/2006 Document Revised: 05/23/2011 Document Reviewed: 09/11/2006 ExitCare Patient Information 2014 ExitCare, [place].   ________________________________________________________________________ WHAT IS A BLOOD TRANSFUSION? Blood Transfusion Information  A transfusion is the replacement of blood or some of its parts. Blood is made up of multiple cells which provide different functions. Red blood cells carry oxygen and are used for blood loss replacement. White blood cells fight against infection. Platelets control bleeding. Plasma helps clot blood. Other blood products are available for specialized needs, such as  hemophilia or other clotting disorders. BEFORE THE TRANSFUSION  Who gives blood for transfusions?  Healthy volunteers who are fully evaluated to make sure their blood is safe. This is blood bank blood. Transfusion therapy is the safest it has ever been in the practice of  medicine. Before blood is taken from a donor, a complete history is taken to make sure that person has no history of diseases nor engages in risky social behavior (examples are intravenous drug use or sexual activity with multiple partners). The donor's travel history is screened to minimize risk of transmitting infections, such as malaria. The donated blood is tested for signs of infectious diseases, such as HIV and hepatitis. The blood is then tested to be sure it is compatible with you in order to minimize the chance of a transfusion reaction. If you or a relative donates blood, this is often done in anticipation of surgery and is not appropriate for emergency situations. It takes many days to process the donated blood. RISKS AND COMPLICATIONS Although transfusion therapy is very safe and saves many lives, the main dangers of transfusion include:  Getting an infectious disease. Developing a transfusion reaction. This is an allergic reaction to something in the blood you were given. Every precaution is taken to prevent this. The decision to have a blood transfusion has been considered carefully by your caregiver before blood is given. Blood is not given unless the benefits outweigh the risks. AFTER THE TRANSFUSION Right after receiving a blood transfusion, you will usually feel much better and more energetic. This is especially true if your red blood cells have gotten low (anemic). The transfusion raises the level of the red blood cells which carry oxygen, and this usually causes an energy increase. The nurse administering the transfusion will monitor you carefully for complications. HOME CARE INSTRUCTIONS  No special instructions are needed after a transfusion. You may find your energy is better. Speak with your caregiver about any limitations on activity for underlying diseases you may have. SEEK MEDICAL CARE IF:  Your condition is not improving after your transfusion. You develop redness or  irritation at the intravenous (IV) site. SEEK IMMEDIATE MEDICAL CARE IF:  Any of the following symptoms occur over the next 12 hours: Shaking chills. You have a temperature by mouth above 102 F (38.9 C), not controlled by medicine. Chest, back, or muscle pain. People around you feel you are not acting correctly or are confused. Shortness of breath or difficulty breathing. Dizziness and fainting. You get a rash or develop hives. You have a decrease in urine output. Your urine turns a dark color or changes to pink, red, or brown. Any of the following symptoms occur over the next 10 days: You have a temperature by mouth above 102 F (38.9 C), not controlled by medicine. Shortness of breath. Weakness after normal activity. The white part of the eye turns yellow (jaundice). You have a decrease in the amount of urine or are urinating less often. Your urine turns a dark color or changes to pink, red, or brown. Document Released: 02/26/2000 Document Revised: 05/23/2011 Document Reviewed: 10/15/2007 ExitCare Patient Information 2014 [place].  _______________________________________________________________________ [facility]- Preparing for Total Shoulder Arthroplasty    Before surgery, you can play an important role. Because skin is not sterile, your skin needs to be as free of germs as possible. You can reduce the number of germs on your skin by using the following products. Benzoyl Peroxide Gel Reduces the number of germs present on the  skin Applied twice a day to shoulder area starting two days before surgery    ==================================================================  Please follow these instructions carefully:  BENZOYL PEROXIDE 5% GEL  Please do not use if you have an allergy to benzoyl peroxide.   If your skin becomes reddened/irritated stop using the benzoyl peroxide.  Starting two days before surgery, apply as follows: Apply benzoyl peroxide in the morning  and at night. Apply after taking a shower. If you are not taking a shower clean entire shoulder front, back, and side along with the armpit with a clean wet washcloth.  Place a quarter-sized dollop on your shoulder and rub in thoroughly, making sure to cover the front, back, and side of your shoulder, along with the armpit.   2 days before ____ AM   ____ PM              1 day before ____ AM   ____ PM                         Do this twice a day for two days.  (Last application is the night before surgery, AFTER using the CHG soap as described below).  Do NOT apply benzoyl peroxide gel on the day of surgery.

## 2023-05-03 ENCOUNTER — Encounter (HOSPITAL_COMMUNITY): Payer: Self-pay

## 2023-05-03 ENCOUNTER — Other Ambulatory Visit: Payer: Self-pay

## 2023-05-03 ENCOUNTER — Encounter (HOSPITAL_COMMUNITY)
Admission: RE | Admit: 2023-05-03 | Discharge: 2023-05-03 | Disposition: A | Discharge status: Home / Self Care | Payer: Medicare HMO | Source: Ambulatory Visit | Attending: Orthopaedic Surgery | Admitting: Orthopaedic Surgery

## 2023-05-03 DIAGNOSIS — E119 Type 2 diabetes mellitus without complications: Secondary | ICD-10-CM

## 2023-05-03 DIAGNOSIS — Z01818 Encounter for other preprocedural examination: Secondary | ICD-10-CM

## 2023-05-03 HISTORY — DX: Headache, unspecified: R51.9

## 2023-05-05 ENCOUNTER — Encounter (HOSPITAL_COMMUNITY)
Admission: RE | Admit: 2023-05-05 | Discharge: 2023-05-05 | Disposition: A | Discharge status: Home / Self Care | Payer: Medicare HMO | Source: Ambulatory Visit | Attending: Orthopaedic Surgery | Admitting: Orthopaedic Surgery

## 2023-05-05 DIAGNOSIS — E119 Type 2 diabetes mellitus without complications: Secondary | ICD-10-CM | POA: Insufficient documentation

## 2023-05-05 DIAGNOSIS — Z01818 Encounter for other preprocedural examination: Secondary | ICD-10-CM | POA: Insufficient documentation

## 2023-05-05 LAB — BASIC METABOLIC PANEL
Anion gap: 10 (ref 5–15)
BUN: 16 mg/dL (ref 8–23)
CO2: 23 mmol/L (ref 22–32)
Calcium: 9.2 mg/dL (ref 8.9–10.3)
Chloride: 104 mmol/L (ref 98–111)
Creatinine, Ser: 0.91 mg/dL (ref 0.44–1.00)
GFR, Estimated: >60 mL/min (ref >60)
Glucose, Bld: 92 mg/dL (ref 70–99)
Potassium: 4.8 mmol/L (ref 3.5–5.1)
Sodium: 137 mmol/L (ref 135–145)

## 2023-05-05 LAB — CBC
HCT: 46 % (ref 36.0–46.0)
Hemoglobin: 14.8 g/dL (ref 12.0–15.0)
MCH: 30.3 pg (ref 26.0–34.0)
MCHC: 32.2 g/dL (ref 30.0–36.0)
MCV: 94.3 fL (ref 80.0–100.0)
Platelets: 237 10*3/uL (ref 150–400)
RBC: 4.88 MIL/uL (ref 3.87–5.11)
RDW: 13.2 % (ref 11.5–15.5)
WBC: 5.7 10*3/uL (ref 4.0–10.5)
nRBC: 0 % (ref 0.0–0.2)

## 2023-05-05 LAB — HEMOGLOBIN A1C
Hgb A1c MFr Bld: 5.4 % (ref 4.8–5.6)
Mean Plasma Glucose: 108.28 mg/dL

## 2023-05-05 LAB — GLUCOSE, CAPILLARY: Glucose-Capillary: 92 mg/dL (ref 70–99)

## 2023-05-09 NOTE — H&P (Signed)
 PREOPERATIVE H&P  Chief Complaint: RIGHT SHOULDER HARDWARE FAILURE  HPI: Lorraine Horton is a 63 y.o. female who is scheduled for Procedure(s): TOTAL SHOULDER REVISION.   Patient has a past medical history significant for HTN and DM.   Patient had a reverse total shoulder arthroplasty with a latissimus transfer for a massive cuff tear with a hornblower's sign in 2022.  She only did reasonably afterward with an active elevation of about 90-95 degrees.  She never really regained her external rotation.  She never had any signs of infection; however, she currently has had two low-energy dislocations.  Her CT scan was reviewed which demonstrated a possible small avulsion of some posterior cuff that likely happened during reduction or dislocation.  She has no fevers or chills.  No signs of active infection anywhere else.  Symptoms are rated as moderate to severe, and have been worsening.  This is significantly impairing activities of daily living.    Please see clinic note for further details on this patient's care.    She has elected for surgical management.   Past Medical History:  Diagnosis Date   AC (acromioclavicular) joint bone spurs    Anxiety    Arthritis    chronic back, shoulder, knees   Bursitis    Depression    Diabetes mellitus without complication (HCC)    Headache    migraines, no longer an issue   Hypertension    no meds now   Past Surgical History:  Procedure Laterality Date   ANTERIOR CERVICAL DECOMP/DISCECTOMY FUSION N/A 04/13/2021   Procedure: Anterior Cervical Decompression Fusion Cervical five-six, Cervical six-seven, Cervical seven-thoracic one;  Surgeon: Dawley, Alan Mulder, DO;  Location: [facility] OR;  Service: Neurosurgery;  Laterality: N/A;   CHOLECYSTECTOMY  1996   KNEE SURGERY Bilateral    multiple   MOUTH SURGERY     REVERSE SHOULDER ARTHROPLASTY Right 05/13/2020   Procedure: RIGHT REVERSE TOTAL SHOULDER ARTHROPLASTY;  Surgeon: Bjorn Pippin, MD;  Location:  [facility];  Service: Orthopedics;  Laterality: Right;   TUBAL LIGATION  1992   Social History   Socioeconomic History   Marital status: Married    Spouse name: Not on file   Number of children: Not on file   Years of education: Not on file   Highest education level: Not on file  Occupational History   Not on file  Tobacco Use   Smoking status: Never   Smokeless tobacco: Never  Vaping Use   Vaping status: Never Used  Substance and Sexual Activity   Alcohol use: Not Currently    Comment: occ   Drug use: Not Currently    Types: Marijuana    Comment: "Edible hard candies" 3-4 a week   Sexual activity: Yes    Birth control/protection: Surgical    Comment: BTL  Other Topics Concern   Not on file  Social History Narrative   Not on file   Social Drivers of Health   Financial Resource Strain: Not on file  Food Insecurity: Not on file  Transportation Needs: Not on file  Physical Activity: Not on file  Stress: Not on file  Social Connections: Not on file   Family History  Problem Relation Age of Onset   Stroke Mother    High blood pressure Mother    High blood pressure Father    Diabetes Father    Breast cancer Neg Hx    Allergies  Allergen Reactions   Bee Venom Shortness  Of Breath and Swelling   Codeine Nausea And Vomiting and Other (See Comments)    REACTION: Passes out in addition to nausea and vomiting   Darvocet [Propoxyphene N-Acetaminophen] Nausea And Vomiting and Other (See Comments)    REACTION: passes out in addition to nausea and vomiting   Prior to Admission medications   Medication Sig Start Date End Date Taking? Authorizing Provider  atorvastatin (LIPITOR) 20 MG tablet Take 20 mg by mouth daily. 04/27/18  Yes [provider]  buPROPion (WELLBUTRIN XL) 150 MG 24 hr tablet Take 450 mg by mouth daily. 05/05/18  Yes [provider]  DULoxetine (CYMBALTA) 60 MG capsule Take 60 mg by mouth daily. 10/25/19  Yes [provider]  empagliflozin (JARDIANCE) 10 MG TABS tablet Take 10 mg by mouth daily.   Yes [provider]  FLUoxetine (PROZAC) 40 MG capsule Take 80 mg by mouth daily. 04/06/18  Yes [provider]  gabapentin (NEURONTIN) 800 MG tablet Take 800 mg by mouth 4 (four) times daily. 05/09/18  Yes [provider]  meloxicam (MOBIC) 15 MG tablet Take 1 tablet by mouth daily. 05/14/21  Yes [provider]  methocarbamol (ROBAXIN) 500 MG tablet Take 1 tablet (500 mg total) by mouth 4 (four) times daily. 04/14/21  Yes Council Mechanic, NP  MOUNJARO 5 MG/0.5ML Pen Inject 5 mg into the skin once a week. 04/10/23  Yes [provider]  promethazine (PHENERGAN) 12.5 MG tablet Take 1 tablet (12.5 mg total) by mouth every 6 (six) hours as needed for nausea or vomiting. 05/13/20  Yes Vladimir Lenhoff, Jerald Kief, PA-C  topiramate (TOPAMAX) 25 MG tablet Take 1 tablet by mouth 2 (two) times daily.   Yes [provider]  traMADol (ULTRAM) 50 MG tablet Take 50 mg by mouth every 6 (six) hours as needed for moderate pain (pain score 4-6).   Yes [provider]  Vitamin D, Ergocalciferol, (DRISDOL) 1.25 MG (50000 UNIT) CAPS capsule Take 50,000 Units by mouth once a week. 03/24/23  Yes [provider]  Potassium 99 MG TABS Take 99 mg by mouth daily. Patient not taking: Reported on 05/03/2023    [provider]    ROS: All other systems have been reviewed and were otherwise negative with the exception of those mentioned in the HPI and as above.  Physical Exam: General: Alert, no acute distress Cardiovascular: No pedal edema Respiratory: No cyanosis, no use of accessory musculature GI: No organomegaly, abdomen is soft and non-tender Skin: No lesions in the area of chief complaint Neurologic: Sensation intact distally Psychiatric: Patient is competent for consent with normal mood and affect Lymphatic: No axillary or cervical  lymphadenopathy  MUSCULOSKELETAL:  Range of motion of the shoulder is to about 90 degrees actively.  She has an obvious hornblower's type of sign.  Distal motor and sensory functions are intact.    Imaging: CT of the right shoulder demonstrates no obvious hardware complicating features. She does have an area that may represent a small fracture fragment on the lateral aspect of the proximal humerus  BMI: Estimated body mass index is 43.9 kg/m as calculated from the following:   Height as of 03/10/23: 5\' 6"  (1.676 m).   Weight as of 03/10/23: 123.4 kg.  Lab Results  Component Value Date   ALBUMIN 3.6 10/07/2019   Diabetes: Patient does not have a diagnosis of diabetes. Lab Results  Component Value Date   HGBA1C 5.4 05/05/2023     Smoking Status:  reports that she has never smoked. She has never used smokeless tobacco.     Assessment: RIGHT SHOULDER HARDWARE FAILURE  Plan: Plan for Procedure(s): TOTAL SHOULDER REVISION  The risks benefits and alternatives were discussed with the patient including but not limited to the risks of nonoperative treatment, versus surgical intervention including infection, bleeding, nerve injury,  blood clots, cardiopulmonary complications, morbidity, mortality, among others, and they were willing to proceed.   We additionally specifically discussed risks of axillary nerve injury, infection, periprosthetic fracture, continued pain and longevity of implants prior to beginning procedure.    Patient will be closely monitored in PACU for medical stabilization and pain control. If found stable in PACU, patient may be discharged home with outpatient follow-up. If any concerns regarding patient's stabilization patient will be admitted for observation after surgery. The patient is planning to be discharged home with outpatient PT.   The patient acknowledged the explanation, agreed to proceed with the plan and consent was signed.   Operative Plan: Right  shoulder revision reverse total shoulder arthropalsty  Discharge Medications: standard DVT Prophylaxis: aspirin Physical Therapy: delayed outpatient PT Special Discharge needs: Sling. [address]   Vernetta Honey, PA-C  05/09/2023 7:23 PM

## 2023-05-09 NOTE — Discharge Instructions (Signed)
 Ramond Marrow MD, MPH Alfonse Alpers, PA-C [facility] [address], [address] [phone] (tel)   [phone] (fax)   POST-OPERATIVE INSTRUCTIONS - TOTAL SHOULDER REPLACEMENT    WOUND CARE You may leave the operative dressing in place until your follow-up appointment. KEEP THE INCISIONS CLEAN AND DRY. There may be a small amount of fluid/bleeding leaking at the surgical site. This is normal after surgery.  If it fills with liquid or blood please call us immediately to change it for you. Use the provided ice machine or Ice packs as often as possible for the first 3-4 days, then as needed for pain relief.   Keep a layer of cloth or a shirt between your skin and the cooling unit to prevent frost bite as it can get very cold.  SHOWERING: - You may shower on Post-Op Day #2.  - The dressing is water resistant but do not scrub it as it may start to peel up.   - You may remove the sling for showering - Gently pat the area dry.  - Do not soak the shoulder in water.  - Do not go swimming in the pool or ocean until your incision has completely healed (about 4-6 weeks after surgery) - KEEP THE INCISIONS CLEAN AND DRY.  EXERCISES Wear the sling at all times  You may remove the sling for showering, but keep the arm across the chest or in a secondary sling.    Accidental/Purposeful External Rotation and shoulder flexion (reaching behind you) is to be avoided at all costs for the first month. It is ok to come out of your sling if your are sitting and have assistance for eating.   Do not lift anything heavier than 1 pound until we discuss it further in clinic.  It is normal for your fingers/hand to become more swollen after surgery and discolored from bruising.   This will resolve over the first few weeks usually after surgery. Please continue to ambulate and do not stay sitting or lying for too long.  Perform foot and wrist pumps to assist in circulation.  PHYSICAL  THERAPY - No therapy for 4 weeks after surgery   REGIONAL ANESTHESIA (NERVE BLOCKS) The anesthesia team may have performed a nerve block for you this is a great tool used to minimize pain.   The block may start wearing off overnight (between 8-24 hours postop) When the block wears off, your pain may go from nearly zero to the pain you would have had postop without the block. This is an abrupt transition but nothing dangerous is happening.   This can be a challenging period but utilize your as needed pain medications to try and manage this period. We suggest you use the pain medication the first night prior to going to bed, to ease this transition.  You may take an extra dose of narcotic when this happens if needed   POST-OP MEDICATIONS- Multimodal approach to pain control In general your pain will be controlled with a combination of substances.  Prescriptions unless otherwise discussed are electronically sent to your pharmacy.  This is a carefully made plan we use to minimize narcotic use.     Meloxicam - Anti-inflammatory medication taken on a scheduled basis Acetaminophen - Non-narcotic pain medicine taken on a scheduled basis  Oxycodone - This is a strong narcotic, to be used only on an "as needed" basis for SEVERE pain. Aspirin 81mg  - This medicine is used to minimize the risk of blood  clots after surgery. Omeprazole - daily medicine to protect your stomach while taking anti-inflammatories.   Phenergan -  take as needed for nausea Amoxicillin - this is an antibiotic, take as instructed   FOLLOW-UP If you develop a Fever (>101.5), Redness or Drainage from the surgical incision site, please call our office to arrange for an evaluation. Please call the office to schedule a follow-up appointment for a wound check, 7-10 days post-operatively.  IF YOU HAVE ANY QUESTIONS, PLEASE FEEL FREE TO CALL OUR OFFICE.  HELPFUL INFORMATION  Your arm will be in a sling following surgery. You will  be in this sling for the next 4 weeks.   You may be more comfortable sleeping in a semi-seated position the first few nights following surgery.  Keep a pillow propped under the elbow and forearm for comfort.  If you have a recliner type of chair it might be beneficial.  If not that is fine too, but it would be helpful to sleep propped up with pillows behind your operated shoulder as well under your elbow and forearm.  This will reduce pulling on the suture lines.  When dressing, put your operative arm in the sleeve first.  When getting undressed, take your operative arm out last.  Loose fitting, button-down shirts are recommended.  In most states it is against the law to drive while your arm is in a sling. And certainly against the law to drive while taking narcotics.  You may return to work/school in the next couple of days when you feel up to it. Desk work and typing in the sling is fine.  We suggest you use the pain medication the first night prior to going to bed, in order to ease any pain when the anesthesia wears off. You should avoid taking pain medications on an empty stomach as it will make you nauseous.  You should wean off your narcotic medicines as soon as you are able.     Most patients will be off narcotics before their first postop appointment.   Do not drink alcoholic beverages or take illicit drugs when taking pain medications.  Pain medication may make you constipated.  Below are a few solutions to try in this order: Decrease the amount of pain medication if you aren't having pain. Drink lots of decaffeinated fluids. Drink prune juice and/or each dried prunes  If the first 3 don't work start with additional solutions Take Colace - an over-the-counter stool softener Take Senokot - an over-the-counter laxative Take Miralax - a stronger over-the-counter laxative   Dental Antibiotics:  In most cases prophylactic antibiotics for Dental procdeures after total joint surgery  are not necessary.  Exceptions are as follows:  1. History of prior total joint infection  2. Severely immunocompromised (Organ Transplant, cancer chemotherapy, Rheumatoid biologic meds such as Humira)  3. Poorly controlled diabetes (A1C &gt; 8.0, blood glucose over 200)  If you have one of these conditions, contact your surgeon for an antibiotic prescription, prior to your dental procedure.   For more information including helpful videos and documents visit our website:   [email or web address]

## 2023-05-10 ENCOUNTER — Other Ambulatory Visit: Payer: Self-pay

## 2023-05-10 ENCOUNTER — Inpatient Hospital Stay (HOSPITAL_COMMUNITY)
Admission: RE | Admit: 2023-05-10 | Discharge: 2023-05-11 | DRG: 483 | Disposition: A | Discharge status: Home / Self Care | Payer: Medicare HMO | Attending: Orthopaedic Surgery | Admitting: Orthopaedic Surgery

## 2023-05-10 ENCOUNTER — Encounter (HOSPITAL_COMMUNITY): Payer: Self-pay | Admitting: Orthopaedic Surgery

## 2023-05-10 ENCOUNTER — Inpatient Hospital Stay (HOSPITAL_COMMUNITY): Payer: Medicare HMO | Admitting: Anesthesiology

## 2023-05-10 ENCOUNTER — Encounter (HOSPITAL_COMMUNITY)
Admission: RE | Disposition: A | Discharge status: Home / Self Care | Payer: Self-pay | Source: Home / Self Care | Attending: Orthopaedic Surgery

## 2023-05-10 ENCOUNTER — Inpatient Hospital Stay (HOSPITAL_COMMUNITY): Payer: Medicare HMO

## 2023-05-10 DIAGNOSIS — Z7982 Long term (current) use of aspirin: Secondary | ICD-10-CM | POA: Diagnosis not present

## 2023-05-10 DIAGNOSIS — T84098A Other mechanical complication of other internal joint prosthesis, initial encounter: Secondary | ICD-10-CM

## 2023-05-10 DIAGNOSIS — E669 Obesity, unspecified: Secondary | ICD-10-CM | POA: Diagnosis present

## 2023-05-10 DIAGNOSIS — Z7985 Long-term (current) use of injectable non-insulin antidiabetic drugs: Secondary | ICD-10-CM

## 2023-05-10 DIAGNOSIS — Z7984 Long term (current) use of oral hypoglycemic drugs: Secondary | ICD-10-CM | POA: Diagnosis not present

## 2023-05-10 DIAGNOSIS — E119 Type 2 diabetes mellitus without complications: Secondary | ICD-10-CM

## 2023-05-10 DIAGNOSIS — M25311 Other instability, right shoulder: Secondary | ICD-10-CM | POA: Diagnosis present

## 2023-05-10 DIAGNOSIS — I10 Essential (primary) hypertension: Secondary | ICD-10-CM

## 2023-05-10 DIAGNOSIS — Z79899 Other long term (current) drug therapy: Secondary | ICD-10-CM | POA: Diagnosis not present

## 2023-05-10 DIAGNOSIS — Y792 Prosthetic and other implants, materials and accessory orthopedic devices associated with adverse incidents: Secondary | ICD-10-CM | POA: Diagnosis present

## 2023-05-10 DIAGNOSIS — Z791 Long term (current) use of non-steroidal anti-inflammatories (NSAID): Secondary | ICD-10-CM

## 2023-05-10 DIAGNOSIS — F32A Depression, unspecified: Secondary | ICD-10-CM | POA: Diagnosis present

## 2023-05-10 DIAGNOSIS — F419 Anxiety disorder, unspecified: Secondary | ICD-10-CM | POA: Diagnosis present

## 2023-05-10 DIAGNOSIS — Z6841 Body Mass Index (BMI) 40.0 and over, adult: Secondary | ICD-10-CM | POA: Diagnosis not present

## 2023-05-10 DIAGNOSIS — Z981 Arthrodesis status: Secondary | ICD-10-CM | POA: Diagnosis not present

## 2023-05-10 DIAGNOSIS — Z833 Family history of diabetes mellitus: Secondary | ICD-10-CM

## 2023-05-10 DIAGNOSIS — Z96611 Presence of right artificial shoulder joint: Principal | ICD-10-CM

## 2023-05-10 DIAGNOSIS — Z823 Family history of stroke: Secondary | ICD-10-CM | POA: Diagnosis not present

## 2023-05-10 DIAGNOSIS — T84028A Dislocation of other internal joint prosthesis, initial encounter: Principal | ICD-10-CM | POA: Diagnosis present

## 2023-05-10 DIAGNOSIS — Z8249 Family history of ischemic heart disease and other diseases of the circulatory system: Secondary | ICD-10-CM | POA: Diagnosis not present

## 2023-05-10 DIAGNOSIS — Z885 Allergy status to narcotic agent status: Secondary | ICD-10-CM

## 2023-05-10 DIAGNOSIS — Z91030 Bee allergy status: Secondary | ICD-10-CM | POA: Diagnosis not present

## 2023-05-10 HISTORY — PX: TOTAL SHOULDER REVISION: SHX6130

## 2023-05-10 LAB — TYPE AND SCREEN
ABO/RH(D): A POS
Antibody Screen: NEGATIVE

## 2023-05-10 LAB — GLUCOSE, CAPILLARY
Glucose-Capillary: 101 mg/dL — ABNORMAL HIGH (ref 70–99)
Glucose-Capillary: 106 mg/dL — ABNORMAL HIGH (ref 70–99)

## 2023-05-10 LAB — SURGICAL PCR SCREEN
MRSA, PCR: NEGATIVE
Staphylococcus aureus: NEGATIVE

## 2023-05-10 SURGERY — REVISION, TOTAL ARTHROPLASTY, SHOULDER
Anesthesia: General | Site: Shoulder | Laterality: Right

## 2023-05-10 MED ORDER — SODIUM CHLORIDE 0.9 % IV SOLN: 12.5000 mg | INTRAVENOUS | Status: DC | PRN

## 2023-05-10 MED ORDER — SODIUM CHLORIDE 0.9 % IR SOLN
Status: DC | PRN
  Administered 2023-05-10: 1000 mL

## 2023-05-10 MED ORDER — DEXAMETHASONE SODIUM PHOSPHATE 10 MG/ML IJ SOLN
INTRAMUSCULAR | Status: DC | PRN
  Administered 2023-05-10: 8 mg via INTRAVENOUS

## 2023-05-10 MED ORDER — OXYCODONE HCL 5 MG PO TABS
ORAL_TABLET | ORAL | Status: AC
  Administered 2023-05-10: 5 mg via ORAL
  Filled 2023-05-10: qty 1

## 2023-05-10 MED ORDER — OXYCODONE HCL 5 MG/5ML PO SOLN: 5.0000 mg | Freq: Once | ORAL | Status: AC | PRN

## 2023-05-10 MED ORDER — ONDANSETRON HCL 4 MG/2ML IJ SOLN
INTRAMUSCULAR | Status: DC | PRN
  Administered 2023-05-10: 4 mg via INTRAVENOUS

## 2023-05-10 MED ORDER — LACTATED RINGERS IV SOLN: INTRAVENOUS | Status: DC

## 2023-05-10 MED ORDER — AMOXICILLIN 500 MG PO TABS: 500.0000 mg | ORAL_TABLET | Freq: Two times a day (BID) | ORAL | 0 refills | Status: AC

## 2023-05-10 MED ORDER — OXYCODONE HCL 5 MG PO TABS: 5.0000 mg | ORAL_TABLET | ORAL | Status: DC | PRN

## 2023-05-10 MED ORDER — INSULIN ASPART 100 UNIT/ML IJ SOLN: 0.0000 [IU] | INTRAMUSCULAR | Status: DC | PRN

## 2023-05-10 MED ORDER — ORAL CARE MOUTH RINSE: 15.0000 mL | Freq: Once | OROMUCOSAL | Status: AC

## 2023-05-10 MED ORDER — ROCURONIUM BROMIDE 10 MG/ML (PF) SYRINGE
PREFILLED_SYRINGE | INTRAVENOUS | Status: DC | PRN
  Administered 2023-05-10: 60 mg via INTRAVENOUS

## 2023-05-10 MED ORDER — FENTANYL CITRATE PF 50 MCG/ML IJ SOSY
PREFILLED_SYRINGE | INTRAMUSCULAR | Status: AC
Start: 2023-05-10 — End: 2023-05-11
  Filled 2023-05-10: qty 1

## 2023-05-10 MED ORDER — SUGAMMADEX SODIUM 200 MG/2ML IV SOLN
INTRAVENOUS | Status: DC | PRN
  Administered 2023-05-10: 250 mg via INTRAVENOUS

## 2023-05-10 MED ORDER — ACETAMINOPHEN 500 MG PO TABS
1000.0000 mg | ORAL_TABLET | Freq: Once | ORAL | Status: AC
  Administered 2023-05-10: 1000 mg via ORAL
  Filled 2023-05-10: qty 2

## 2023-05-10 MED ORDER — FENTANYL CITRATE PF 50 MCG/ML IJ SOSY
25.0000 ug | PREFILLED_SYRINGE | INTRAMUSCULAR | Status: DC | PRN
  Administered 2023-05-10 (×2): 50 ug via INTRAVENOUS

## 2023-05-10 MED ORDER — ASPIRIN 81 MG PO CHEW: 81.0000 mg | CHEWABLE_TABLET | Freq: Two times a day (BID) | ORAL | 0 refills | Status: AC

## 2023-05-10 MED ORDER — ACETAMINOPHEN 500 MG PO TABS: 1000.0000 mg | ORAL_TABLET | Freq: Three times a day (TID) | ORAL | 0 refills | Status: AC

## 2023-05-10 MED ORDER — METHOCARBAMOL 500 MG PO TABS: 500.0000 mg | ORAL_TABLET | Freq: Four times a day (QID) | ORAL | Status: DC | PRN

## 2023-05-10 MED ORDER — OXYCODONE HCL 5 MG PO TABS: ORAL_TABLET | ORAL | 0 refills | Status: AC

## 2023-05-10 MED ORDER — VANCOMYCIN HCL 1000 MG IV SOLR
INTRAVENOUS | Status: AC
  Filled 2023-05-10: qty 20

## 2023-05-10 MED ORDER — METHOCARBAMOL 1000 MG/10ML IJ SOLN: 500.0000 mg | Freq: Four times a day (QID) | INTRAMUSCULAR | Status: DC | PRN

## 2023-05-10 MED ORDER — BISACODYL 10 MG RE SUPP: 10.0000 mg | Freq: Every day | RECTAL | Status: DC | PRN

## 2023-05-10 MED ORDER — ONDANSETRON HCL 4 MG PO TABS: 4.0000 mg | ORAL_TABLET | Freq: Four times a day (QID) | ORAL | Status: DC | PRN

## 2023-05-10 MED ORDER — CHLORHEXIDINE GLUCONATE 0.12 % MT SOLN
15.0000 mL | Freq: Once | OROMUCOSAL | Status: AC
  Administered 2023-05-10: 15 mL via OROMUCOSAL

## 2023-05-10 MED ORDER — PROMETHAZINE HCL 12.5 MG PO TABS: 12.5000 mg | ORAL_TABLET | Freq: Four times a day (QID) | ORAL | 0 refills | Status: AC | PRN

## 2023-05-10 MED ORDER — BUPIVACAINE LIPOSOME 1.3 % IJ SUSP
INTRAMUSCULAR | Status: DC | PRN
  Administered 2023-05-10: 10 mL via PERINEURAL

## 2023-05-10 MED ORDER — POLYETHYLENE GLYCOL 3350 17 G PO PACK: 17.0000 g | PACK | Freq: Every day | ORAL | Status: DC | PRN

## 2023-05-10 MED ORDER — PHENYLEPHRINE HCL-NACL 20-0.9 MG/250ML-% IV SOLN
INTRAVENOUS | Status: DC | PRN
  Administered 2023-05-10: 40 ug/min via INTRAVENOUS

## 2023-05-10 MED ORDER — CEFAZOLIN SODIUM-DEXTROSE 2-4 GM/100ML-% IV SOLN
2.0000 g | INTRAVENOUS | Status: AC
  Administered 2023-05-10: 2 g via INTRAVENOUS
  Filled 2023-05-10: qty 100

## 2023-05-10 MED ORDER — LIDOCAINE HCL (PF) 2 % IJ SOLN
INTRAMUSCULAR | Status: AC
  Filled 2023-05-10: qty 5

## 2023-05-10 MED ORDER — LIDOCAINE HCL (CARDIAC) PF 100 MG/5ML IV SOSY
PREFILLED_SYRINGE | INTRAVENOUS | Status: DC | PRN
  Administered 2023-05-10: 40 mg via INTRATRACHEAL

## 2023-05-10 MED ORDER — TRANEXAMIC ACID-NACL 1000-0.7 MG/100ML-% IV SOLN
1000.0000 mg | INTRAVENOUS | Status: AC
  Administered 2023-05-10: 1000 mg via INTRAVENOUS
  Filled 2023-05-10: qty 100

## 2023-05-10 MED ORDER — MENTHOL 3 MG MT LOZG: 1.0000 | LOZENGE | OROMUCOSAL | Status: DC | PRN

## 2023-05-10 MED ORDER — OXYCODONE HCL 5 MG PO TABS: 10.0000 mg | ORAL_TABLET | ORAL | Status: DC | PRN

## 2023-05-10 MED ORDER — FENTANYL CITRATE (PF) 100 MCG/2ML IJ SOLN
INTRAMUSCULAR | Status: AC
Start: 2023-05-10 — End: ?
  Filled 2023-05-10: qty 2

## 2023-05-10 MED ORDER — SUGAMMADEX SODIUM 200 MG/2ML IV SOLN
INTRAVENOUS | Status: AC
  Filled 2023-05-10: qty 2

## 2023-05-10 MED ORDER — PRONTOSAN WOUND IRRIGATION OPTIME
TOPICAL | Status: DC | PRN
  Administered 2023-05-10: 350 mL

## 2023-05-10 MED ORDER — FENTANYL CITRATE (PF) 100 MCG/2ML IJ SOLN
INTRAMUSCULAR | Status: AC
  Filled 2023-05-10: qty 2

## 2023-05-10 MED ORDER — MELOXICAM 7.5 MG PO TABS
7.5000 mg | ORAL_TABLET | Freq: Two times a day (BID) | ORAL | 0 refills | Status: AC
Start: 2023-05-10 — End: 2023-06-09

## 2023-05-10 MED ORDER — MIDAZOLAM HCL 2 MG/2ML IJ SOLN
INTRAMUSCULAR | Status: AC
  Filled 2023-05-10: qty 2

## 2023-05-10 MED ORDER — PROPOFOL 10 MG/ML IV BOLUS
INTRAVENOUS | Status: DC | PRN
Start: 2023-05-10 — End: 2023-05-10
  Administered 2023-05-10: 150 mg via INTRAVENOUS

## 2023-05-10 MED ORDER — HYDROMORPHONE HCL 1 MG/ML IJ SOLN: 0.5000 mg | INTRAMUSCULAR | Status: DC | PRN

## 2023-05-10 MED ORDER — ACETAMINOPHEN 500 MG PO TABS
1000.0000 mg | ORAL_TABLET | Freq: Once | ORAL | Status: DC
Start: 2023-05-10 — End: 2023-05-10

## 2023-05-10 MED ORDER — PHENYLEPHRINE 80 MCG/ML (10ML) SYRINGE FOR IV PUSH (FOR BLOOD PRESSURE SUPPORT)
PREFILLED_SYRINGE | INTRAVENOUS | Status: DC | PRN
  Administered 2023-05-10: 40 ug via INTRAVENOUS

## 2023-05-10 MED ORDER — MIDAZOLAM HCL 2 MG/2ML IJ SOLN
1.0000 mg | Freq: Once | INTRAMUSCULAR | Status: AC
  Administered 2023-05-10: 1 mg via INTRAVENOUS
  Filled 2023-05-10: qty 2

## 2023-05-10 MED ORDER — FENTANYL CITRATE (PF) 100 MCG/2ML IJ SOLN
INTRAMUSCULAR | Status: DC | PRN
  Administered 2023-05-10: 100 ug via INTRAVENOUS
  Administered 2023-05-10 (×2): 25 ug via INTRAVENOUS

## 2023-05-10 MED ORDER — FENTANYL CITRATE PF 50 MCG/ML IJ SOSY
50.0000 ug | PREFILLED_SYRINGE | Freq: Once | INTRAMUSCULAR | Status: AC
Start: 2023-05-10 — End: 2023-05-10
  Administered 2023-05-10: 50 ug via INTRAVENOUS
  Filled 2023-05-10: qty 2

## 2023-05-10 MED ORDER — PHENYLEPHRINE 80 MCG/ML (10ML) SYRINGE FOR IV PUSH (FOR BLOOD PRESSURE SUPPORT)
PREFILLED_SYRINGE | INTRAVENOUS | Status: AC
  Filled 2023-05-10: qty 10

## 2023-05-10 MED ORDER — METOCLOPRAMIDE HCL 5 MG/ML IJ SOLN: 5.0000 mg | Freq: Three times a day (TID) | INTRAMUSCULAR | Status: DC | PRN

## 2023-05-10 MED ORDER — FENTANYL CITRATE PF 50 MCG/ML IJ SOSY
PREFILLED_SYRINGE | INTRAMUSCULAR | Status: AC
  Administered 2023-05-10: 50 ug via INTRAVENOUS
  Filled 2023-05-10: qty 2

## 2023-05-10 MED ORDER — BUPIVACAINE HCL (PF) 0.5 % IJ SOLN
INTRAMUSCULAR | Status: DC | PRN
  Administered 2023-05-10: 15 mL via PERINEURAL

## 2023-05-10 MED ORDER — DOCUSATE SODIUM 100 MG PO CAPS
100.0000 mg | ORAL_CAPSULE | Freq: Two times a day (BID) | ORAL | Status: DC
  Administered 2023-05-10 – 2023-05-11 (×2): 100 mg via ORAL
  Filled 2023-05-10 (×2): qty 1

## 2023-05-10 MED ORDER — ACETAMINOPHEN 500 MG PO TABS
1000.0000 mg | ORAL_TABLET | Freq: Four times a day (QID) | ORAL | Status: DC
  Administered 2023-05-10 – 2023-05-11 (×3): 1000 mg via ORAL
  Filled 2023-05-10 (×3): qty 2

## 2023-05-10 MED ORDER — VANCOMYCIN HCL 1000 MG IV SOLR
INTRAVENOUS | Status: DC | PRN
  Administered 2023-05-10: 1000 mg via TOPICAL

## 2023-05-10 MED ORDER — OXYCODONE HCL 5 MG PO TABS: 5.0000 mg | ORAL_TABLET | Freq: Once | ORAL | Status: AC | PRN

## 2023-05-10 MED ORDER — PHENOL 1.4 % MT LIQD: 1.0000 | OROMUCOSAL | Status: DC | PRN

## 2023-05-10 MED ORDER — ONDANSETRON HCL 4 MG/2ML IJ SOLN
INTRAMUSCULAR | Status: AC
  Filled 2023-05-10: qty 2

## 2023-05-10 MED ORDER — MAGNESIUM CITRATE PO SOLN: 1.0000 | Freq: Once | ORAL | Status: DC | PRN

## 2023-05-10 MED ORDER — OMEPRAZOLE 20 MG PO CPDR: 20.0000 mg | DELAYED_RELEASE_CAPSULE | Freq: Every day | ORAL | 0 refills | Status: AC

## 2023-05-10 MED ORDER — DIPHENHYDRAMINE HCL 12.5 MG/5ML PO ELIX: 12.5000 mg | ORAL_SOLUTION | ORAL | Status: DC | PRN

## 2023-05-10 MED ORDER — AMISULPRIDE (ANTIEMETIC) 5 MG/2ML IV SOLN: 10.0000 mg | Freq: Once | INTRAVENOUS | Status: DC | PRN

## 2023-05-10 MED ORDER — ONDANSETRON HCL 4 MG/2ML IJ SOLN: 4.0000 mg | Freq: Four times a day (QID) | INTRAMUSCULAR | Status: DC | PRN

## 2023-05-10 MED ORDER — METOCLOPRAMIDE HCL 5 MG PO TABS: 5.0000 mg | ORAL_TABLET | Freq: Three times a day (TID) | ORAL | Status: DC | PRN

## 2023-05-10 MED ORDER — PROPOFOL 10 MG/ML IV BOLUS
INTRAVENOUS | Status: AC
  Filled 2023-05-10: qty 20

## 2023-05-10 MED ORDER — DEXAMETHASONE SODIUM PHOSPHATE 10 MG/ML IJ SOLN
INTRAMUSCULAR | Status: AC
  Filled 2023-05-10: qty 1

## 2023-05-10 MED ORDER — ROCURONIUM BROMIDE 10 MG/ML (PF) SYRINGE
PREFILLED_SYRINGE | INTRAVENOUS | Status: AC
  Filled 2023-05-10: qty 10

## 2023-05-10 SURGICAL SUPPLY — 76 items
ANCHOR SUT FBRTK 2.6 SP #5 (Anchor) IMPLANT
BAG COUNTER SPONGE SURGICOUNT (BAG) ×1 IMPLANT
BIT DRILL 3.2 PERIPHERAL SCREW (BIT) IMPLANT
BLADE SAW SAG 29X58X.64 (BLADE) IMPLANT
BLADE SAW SGTL 73X25 THK (BLADE) IMPLANT
BLADE SAW SGTL 81X20 HD (BLADE) ×1 IMPLANT
BRUSH FEMORAL CANAL (MISCELLANEOUS) IMPLANT
CEMENT BONE DEPUY (Cement) IMPLANT
CHLORAPREP W/TINT 26 (MISCELLANEOUS) ×2 IMPLANT
CNTNR URN SCR LID CUP LEK RST (MISCELLANEOUS) ×3 IMPLANT
COOLER ICEMAN CLASSIC (MISCELLANEOUS) ×1 IMPLANT
COVER BACK TABLE 60X90IN (DRAPES) IMPLANT
COVER SURGICAL LIGHT HANDLE (MISCELLANEOUS) ×1 IMPLANT
DRAPE C-ARM 42X120 X-RAY (DRAPES) IMPLANT
DRAPE INCISE IOBAN 66X45 STRL (DRAPES) ×1 IMPLANT
DRAPE POUCH INSTRU U-SHP 10X18 (DRAPES) ×1 IMPLANT
DRAPE SHEET LG 3/4 BI-LAMINATE (DRAPES) ×1 IMPLANT
DRAPE SURG ORHT 6 SPLT 77X108 (DRAPES) ×2 IMPLANT
DRAPE TOP 10253 STERILE (DRAPES) ×1 IMPLANT
DRSG AQUACEL AG ADV 3.5X 6 (GAUZE/BANDAGES/DRESSINGS) ×1 IMPLANT
ELECT BLADE TIP CTD 4 INCH (ELECTRODE) ×1 IMPLANT
ELECT NDL TIP 2.8 STRL (NEEDLE) IMPLANT
ELECT NEEDLE TIP 2.8 STRL (NEEDLE) IMPLANT
ELECT REM PT RETURN 15FT ADLT (MISCELLANEOUS) ×1 IMPLANT
EVACUATOR 1/8 PVC DRAIN (DRAIN) IMPLANT
FACESHIELD WRAPAROUND (MASK) ×2 IMPLANT
FACESHIELD WRAPAROUND OR TEAM (MASK) ×3 IMPLANT
GAUZE SPONGE 4X4 12PLY STRL (GAUZE/BANDAGES/DRESSINGS) ×1 IMPLANT
GAUZE XEROFORM 1X8 LF (GAUZE/BANDAGES/DRESSINGS) ×1 IMPLANT
GLENOSPHERE CANN +3 (Orthopedic Implant) IMPLANT
GLOVE BIO SURGEON STRL SZ 6.5 (GLOVE) ×2 IMPLANT
GLOVE BIOGEL PI IND STRL 6.5 (GLOVE) ×1 IMPLANT
GLOVE BIOGEL PI IND STRL 8 (GLOVE) ×1 IMPLANT
GLOVE ECLIPSE 8.0 STRL XLNG CF (GLOVE) ×1 IMPLANT
GOWN STRL REUS W/ TWL LRG LVL3 (GOWN DISPOSABLE) ×1 IMPLANT
GOWN STRL REUS W/ TWL XL LVL3 (GOWN DISPOSABLE) ×1 IMPLANT
INSERT CUP HUM RET 1/2 39 +3 (Insert) IMPLANT
KIT ANCHOR FBRTK 2.6 STR (KITS) IMPLANT
KIT BASIN OR (CUSTOM PROCEDURE TRAY) ×1 IMPLANT
KIT STABILIZATION SHOULDER (MISCELLANEOUS) ×1 IMPLANT
KIT TURNOVER KIT A (KITS) IMPLANT
MANIFOLD NEPTUNE II (INSTRUMENTS) ×1 IMPLANT
NDL 1/2 CIR CATGUT .05X1.09 (NEEDLE) IMPLANT
NEEDLE 1/2 CIR CATGUT .05X1.09 (NEEDLE) IMPLANT
NS IRRIG 1000ML POUR BTL (IV SOLUTION) ×1 IMPLANT
PACK SHOULDER (CUSTOM PROCEDURE TRAY) ×1 IMPLANT
PAD COLD SHLDR WRAP-ON (PAD) ×1 IMPLANT
RESTRAINT HEAD UNIVERSAL NS (MISCELLANEOUS) ×1 IMPLANT
RETRIEVER SUT HEWSON (MISCELLANEOUS) IMPLANT
SCREW BONE 6.5 ODX25 SMALL (Screw) IMPLANT
SCREW PERIPHERAL 5.0X46 (Screw) IMPLANT
SET HNDPC FAN SPRY TIP SCT (DISPOSABLE) ×1 IMPLANT
SLING ARM FOAM STRAP MED (SOFTGOODS) IMPLANT
SLING ARM FOAM STRAP XLG (SOFTGOODS) IMPLANT
SLING ARM IMMOBILIZER LRG (SOFTGOODS) IMPLANT
SLING ARM IMMOBILIZER MED (SOFTGOODS) IMPLANT
SLING ARM IMMOBILIZER XL (CAST SUPPLIES) IMPLANT
SMARTMIX MINI TOWER (MISCELLANEOUS) IMPLANT
SOLUTION PRONTOSAN WOUND 350ML (IRRIGATION / IRRIGATOR) ×1 IMPLANT
SPONGE T-LAP 4X18 ~~LOC~~+RFID (SPONGE) IMPLANT
SUPPORT WRAP ARM LG (MISCELLANEOUS) ×1 IMPLANT
SUT ETHIBOND 2 V 37 (SUTURE) ×1 IMPLANT
SUT ETHIBOND NAB CT1 #1 30IN (SUTURE) IMPLANT
SUT ETHILON 3 0 FSL (SUTURE) ×1 IMPLANT
SUT ETHILON 3 0 PS 1 (SUTURE) IMPLANT
SUT FIBERWIRE #2 38 T-5 BLUE (SUTURE) IMPLANT
SUT FIBERWIRE #5 38 CONV NDL (SUTURE) IMPLANT
SUT MNCRL AB 4-0 PS2 18 (SUTURE) IMPLANT
SUT VIC AB 0 CT1 27XBRD ANBCTR (SUTURE) ×1 IMPLANT
SUT VIC AB 3-0 SH 27X BRD (SUTURE) ×1 IMPLANT
SUTURE FIBERWR #2 38 T-5 BLUE (SUTURE) IMPLANT
SUTURE FIBERWR #5 38 CONV NDL (SUTURE) IMPLANT
TOWEL OR 17X26 10 PK STRL BLUE (TOWEL DISPOSABLE) ×1 IMPLANT
TOWER SMARTMIX MINI (MISCELLANEOUS) IMPLANT
TUBE SUCTION HIGH CAP CLEAR NV (SUCTIONS) ×1 IMPLANT
WATER STERILE IRR 1000ML POUR (IV SOLUTION) ×1 IMPLANT

## 2023-05-10 NOTE — Anesthesia Procedure Notes (Signed)
 Procedure Name: Intubation Date/Time: 05/10/2023 12:54 PM  Performed by: Ludwig Lean, CRNAPre-anesthesia Checklist: Patient identified, Emergency Drugs available, Suction available and Patient being monitored Patient Re-evaluated:Patient Re-evaluated prior to induction Oxygen Delivery Method: Circle system utilized Preoxygenation: Pre-oxygenation with 100% oxygen Induction Type: IV induction Ventilation: Mask ventilation without difficulty Laryngoscope Size: Mac and 4 Grade View: Grade I Tube type: Oral Tube size: 7.5 mm Number of attempts: 1 Airway Equipment and Method: Stylet Placement Confirmation: ETT inserted through vocal cords under direct vision, positive ETCO2 and breath sounds checked- equal and bilateral Secured at: 23 cm Tube secured with: Tape Dental Injury: Teeth and Oropharynx as per pre-operative assessment

## 2023-05-10 NOTE — Anesthesia Postprocedure Evaluation (Signed)
 Anesthesia Post Note  Patient: Lorraine Horton  Procedure(s) Performed: TOTAL SHOULDER REVISION (Right: Shoulder)     Patient location during evaluation: PACU Anesthesia Type: General Level of consciousness: awake and alert Pain management: pain level controlled Vital Signs Assessment: post-procedure vital signs reviewed and stable Respiratory status: spontaneous breathing, nonlabored ventilation and respiratory function stable Cardiovascular status: stable and blood pressure returned to baseline Anesthetic complications: no   No notable events documented.  Last Vitals:  Vitals:   05/10/23 1513 05/10/23 1515  BP: 132/72 119/65  Pulse: 79 84  Resp: (!) 21 (!) 23  Temp:    SpO2: 97% 97%                    Beryle Lathe

## 2023-05-10 NOTE — Interval H&P Note (Signed)
 All questions answered, patient wants to proceed with procedure. ? ?

## 2023-05-10 NOTE — Anesthesia Preprocedure Evaluation (Addendum)
 Anesthesia Evaluation  Patient identified by MRN, date of birth, ID band Patient awake    Reviewed: Allergy & Precautions, NPO status , Patient's Chart, lab work & pertinent test results  History of Anesthesia Complications Negative for: history of anesthetic complications  Airway Mallampati: II  TM Distance: >3 FB Neck ROM: Full    Dental  (+) Dental Advisory Given, Partial Upper, Missing   Pulmonary neg pulmonary ROS   Pulmonary exam normal        Cardiovascular hypertension, Normal cardiovascular exam     Neuro/Psych  Headaches PSYCHIATRIC DISORDERS Anxiety Depression       GI/Hepatic negative GI ROS, Neg liver ROS,,,  Endo/Other  diabetes, Type 2, Oral Hypoglycemic Agents  Class 3 obesity  Renal/GU negative Renal ROS     Musculoskeletal  (+) Arthritis ,    Abdominal  (+) + obese  Peds  Hematology negative hematology ROS (+)   Anesthesia Other Findings On Glp1-a   Reproductive/Obstetrics                             Anesthesia Physical Anesthesia Plan  ASA: 3  Anesthesia Plan: General   Post-op Pain Management: Tylenol PO (pre-op)* and Regional block*   Induction: Intravenous  PONV Risk Score and Plan: 3 and Treatment may vary due to age or medical condition, Ondansetron, Dexamethasone and Midazolam  Airway Management Planned: Oral ETT  Additional Equipment: None  Intra-op Plan:   Post-operative Plan: Extubation in OR  Informed Consent: I have reviewed the patients History and Physical, chart, labs and discussed the procedure including the risks, benefits and alternatives for the proposed anesthesia with the patient or authorized representative who has indicated his/her understanding and acceptance.     Dental advisory given  Plan Discussed with: CRNA and Anesthesiologist  Anesthesia Plan Comments:         Anesthesia Quick Evaluation

## 2023-05-10 NOTE — Anesthesia Procedure Notes (Addendum)
 Anesthesia Regional Block: Interscalene brachial plexus block   Pre-Anesthetic Checklist: , timeout performed,  Correct Patient, Correct Site, Correct Laterality,  Correct Procedure, Correct Position, site marked,  Risks and benefits discussed,  Surgical consent,  Pre-op evaluation,  At surgeon's request and post-op pain management  Laterality: Right  Prep: chloraprep       Needles:  Injection technique: Single-shot  Needle Type: Echogenic Needle     Needle Length: 5cm  Needle Gauge: 21     Additional Needles:   Narrative:  Start time: 05/10/2023 12:04 PM End time: 05/10/2023 12:07 PM Injection made incrementally with aspirations every 5 mL.  Performed by: Personally  Anesthesiologist: Beryle Lathe, MD  Additional Notes: No pain on injection. No increased resistance to injection. Injection made in 5cc increments. Good needle visualization. Patient tolerated the procedure well.

## 2023-05-10 NOTE — Op Note (Signed)
 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  Patient upon examination had normal-appearing soft tissues and no obvious signs of purulence.  Her stem was quite well-fixed however it was clear that her tuberosity had had a fracture at some point and had migrated posteriorly.  This may have been a cause for late instability.  There was some exposed porous coating anteriorly and medially however the tuberosity appearance made it feel as though this was traumatic in nature and it healed back down.  Based on the lack of obvious purulence I felt that it was appropriate to perform a retention of implants and we removed our polyethylene to wash underneath and removed her glenosphere.  We additionally added a suture based anterior restraint and were able to repair the subscapularis/capsular remnant again.  If the patient has a failure again she will need a complete revision of all implants likely  Post-operative plan: The patient will be NWB in sling.  The patient will have admission orders placed and if stable will discharge from PACU.  DVT prophylaxis Aspirin 81 mg twice daily for 6 weeks.  Pain control with PRN pain medication preferring oral medicines.  Follow up plan will be scheduled in approximately 7 days for incision check and XR.  Physical therapy to start after 4 weeks.  Implants: Tornier perform humeral size 3 retentive polyethylene, 39+3 glenosphere, 2 fiber tack anchors 2.6 mm in size  Post-Op Diagnosis: Same Surgeons:Primary: Bjorn Pippin, MD Assistants:Caroline McBane, PA-C Location: [facility] ROOM 07 Anesthesia: General with Exparel Interscalene Antibiotics:  Ancef 2g preop, Vancomycin 1000mg  locally Tourniquet time: None Estimated Blood Loss: 100 Complications: None Specimens: None Implants: Implant Name Type Inv. Item Serial No. Manufacturer Lot No. LRB No. Used Action  GLENOSPHERE CANN +3 - [id number] Orthopedic Implant GLENOSPHERE CANN +3 [id number] [employer]  Right 1 Implanted  ANCHOR SUT FBRTK 2.6 SP #5 - [id number] Anchor ANCHOR SUT FBRTK 2.6 SP #5  [employer] [id number] Right 1 Implanted  ANCHOR SUT FBRTK 2.6 SP #5 - [id number] Anchor ANCHOR SUT FBRTK 2.6 SP #5  [employer] [id number] Right 1 Implanted  INSERT CUP HUM RET 1/2 39 +3 - [id number] Insert INSERT CUP HUM RET 1/2 39 +3 [id number] [employer]  Right 1 Implanted    Indications for Surgery:   Lorraine Horton is a 63 y.o. 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  Benefits and risks of operative and nonoperative management were discussed prior to surgery with patient/guardian(s) and informed consent form was completed.  Infection and need for further surgery were discussed as was prosthetic stability and cuff issues.  We additionally specifically discussed risks of axillary nerve injury, infection, periprosthetic fracture, continued pain and longevity of implants prior to beginning procedure.      Procedure:   The patient was identified in the preoperative holding area where the surgical site was marked. Block placed by anesthesia with exparel.  The patient was taken to the OR where a procedural timeout was called and the above noted anesthesia was induced.  The patient was positioned beachchair on allen table with spider arm positioner.  Preoperative antibiotics were dosed.  The patient's right shoulder was prepped and draped  in the usual sterile fashion.  A second preoperative timeout was called.       We used the previous incision went through the deltopectoral interval.  When we made sure there was appropriate  hemostasis as we progressed.  We identified the cephalic vein and retracted laterally.  We then were able to encounter the use clavipectoral fascial remnant.  It was open and we are able to place a Covell retractor.  The tissues appear to be relatively normal.  We are able to identify the implants after peeling the subscapularis/capsular remnant away and placing stay sutures.  The implants were quite stable and it actually required a shoehorn device to dislocate.  At that point were able to expose the polyethylene.  The polyethylene was removed and we noted no obvious purulent material.  We cleared the stem and irrigated copiously.  We then identified that there was a fracture that had chronically healed posteriorly of the tuberosities.  There was some exposed porous-coated anteriorly however when we placed a broach handle the stem was quite well-fixed.  We turned our attention to the glenosphere.  Removed her glenosphere and identified that the baseplate was quite well-fixed.  We irrigated copiously informed to complete synovectomy.  At that point we were able to upsize the glenosphere to a 39+3.  We retained her baseplate.  We then placed a 2.6 knotless fiber tack anchor loaded with #5 suture in the anterior glenoid.  We then turned attention back to the humerus.  We put our polyethylene in and reduced the joint.  We then placed a second 2.6 fiber tack anchor in the humerus and passed a bone tunnel for #5 FiberWire.  We are able to repair the subscapularis.  The patient's arm was placed in slight external rotation and forward flexion and we passed our knotless mechanism sutures into each anchor performing a double pulley construct.  We were careful not to over tension and performed tightening of these anchors to create anterior restraint.  Final construct was quite stable.  We irrigated with 3 L normal saline placed Prontosan solution by technique.  We placed local vancomycin powder.  Skin was closed in a  multilayer fashion finishing with nylon sutures.  Sling was placed.  Patient woken taken to PACU in stable condition.   Alfonse Alpers, PA-C, present and scrubbed throughout the case, critical for completion in a timely fashion, and for retraction, instrumentation, closure.

## 2023-05-10 NOTE — Transfer of Care (Signed)
 Immediate Anesthesia Transfer of Care Note  Patient: Lorraine Horton  Procedure(s) Performed: Procedure(s): TOTAL SHOULDER REVISION (Right)  Patient Location: PACU  Anesthesia Type:General and Regional  Level of Consciousness: Patient easily awoken, comfortable, cooperative, following commands, responds to stimulation.   Airway & Oxygen Therapy: Patient spontaneously breathing, ventilating well, oxygen via simple oxygen mask.  Post-op Assessment: Report given to PACU RN, vital signs reviewed and stable, moving all extremities.   Post vital signs: Reviewed and stable.  Complications: No apparent anesthesia complications  Last Vitals:  Vitals Value Taken Time  BP 134/80 05/10/23 1432  Temp    Pulse 81 05/10/23 1434  Resp 9 05/10/23 1434  SpO2 100 % 05/10/23 1434  Vitals shown include unfiled device data.  Last Pain:  Vitals:   05/10/23 1116  TempSrc:   PainSc: 0-No pain         Complications: No notable events documented.

## 2023-05-10 NOTE — Plan of Care (Signed)
   Problem: Education: Goal: Knowledge of General Education information will improve Description: Including pain rating scale, medication(s)/side effects and non-pharmacologic comfort measures Outcome: Progressing   Problem: Health Behavior/Discharge Planning: Goal: Ability to manage health-related needs will improve Outcome: Progressing   Problem: Clinical Measurements: Goal: Ability to maintain clinical measurements within normal limits will improve Outcome: Progressing Goal: Will remain free from infection Outcome: Progressing Goal: Diagnostic test results will improve Outcome: Progressing Goal: Respiratory complications will improve Outcome: Progressing Goal: Cardiovascular complication will be avoided Outcome: Progressing   Problem: Activity: Goal: Risk for activity intolerance will decrease Outcome: Progressing   Problem: Nutrition: Goal: Adequate nutrition will be maintained Outcome: Progressing   Problem: Coping: Goal: Level of anxiety will decrease Outcome: Progressing   Problem: Elimination: Goal: Will not experience complications related to bowel motility Outcome: Progressing Goal: Will not experience complications related to urinary retention Outcome: Progressing   Problem: Pain Managment: Goal: General experience of comfort will improve and/or be controlled Outcome: Progressing   Problem: Safety: Goal: Ability to remain free from injury will improve Outcome: Progressing   Problem: Skin Integrity: Goal: Risk for impaired skin integrity will decrease Outcome: Progressing   Problem: Education: Goal: Knowledge of the prescribed therapeutic regimen will improve Outcome: Progressing Goal: Understanding of activity limitations/precautions following surgery will improve Outcome: Progressing Goal: Individualized Educational Video(s) Outcome: Progressing   Problem: Activity: Goal: Ability to tolerate increased activity will improve Outcome: Progressing    Problem: Pain Management: Goal: Pain level will decrease with appropriate interventions Outcome: Progressing

## 2023-05-11 ENCOUNTER — Encounter (HOSPITAL_COMMUNITY): Payer: Self-pay | Admitting: Orthopaedic Surgery

## 2023-05-11 NOTE — Progress Notes (Signed)
   05/11/23 0929  TOC Brief Assessment  Insurance and Status Reviewed  Patient has primary care physician Yes  Home environment has been reviewed home with spouse  Prior level of function: independent  Prior/Current Home Services No current home services  Social Drivers of Health Review SDOH reviewed no interventions necessary  Readmission risk has been reviewed Yes  Transition of care needs no transition of care needs at this time

## 2023-05-11 NOTE — Discharge Summary (Signed)
 Patient ID: BREALYNN CONTINO MRN: [id number] DOB/AGE: 18-Dec-1960 63 y.o.  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 in the setting of reverse shoulder arthroplasty   Discharge Diagnoses:  Principal Problem:   Status post reverse total arthroplasty of right shoulder   Past Medical History:  Diagnosis Date   AC (acromioclavicular) joint bone spurs    Anxiety    Arthritis    chronic back, shoulder, knees   Bursitis    Depression    Diabetes mellitus without complication (HCC)    Headache    migraines, no longer an issue   Hypertension    no meds now     Procedures Performed: Right revision reverse total shoulder arthroplasty   Discharged Condition: stable  Hospital Course: Patient brought in for schedule surgery.  Tolerated procedure well.  Was kept for monitoring overnight for pain control and medical monitoring postop and was found to be stable for DC home the morning after surgery.  Patient was instructed on specific activity restrictions and all questions were answered.  Consults: None  Significant Diagnostic Studies: Cultures  Treatments: Surgery  Discharge Exam: General: sitting up in hospital bed, NAD RUE: Dressing CDI and sling well fitting. Swelling of right hand.  Axillary nerve sensation/motor altered in setting of block and unable to be fully tested.  Distal motor and sensory altered in setting of block.  Disposition: Discharge disposition: 01-Home or Self Care       Discharge Instructions     Call MD for:  redness, tenderness, or signs of infection (pain, swelling, redness, odor or green/yellow discharge around incision site)   Complete by: As directed    Call MD for:  severe uncontrolled pain   Complete by: As directed    Call MD for:  temperature >100.4   Complete by: As directed    Diet - low sodium heart healthy   Complete by: As  directed       Allergies as of 05/11/2023       Reactions   Bee Venom Shortness Of Breath, Swelling   Codeine Nausea And Vomiting, Other (See Comments)   REACTION: Passes out in addition to nausea and vomiting   Darvocet [propoxyphene N-acetaminophen] Nausea And Vomiting, Other (See Comments)   REACTION: passes out in addition to nausea and vomiting        Medication List     STOP taking these medications    traMADol 50 MG tablet Commonly known as: ULTRAM       TAKE these medications    acetaminophen 500 MG tablet Commonly known as: TYLENOL Take 2 tablets (1,000 mg total) by mouth every 8 (eight) hours for 14 days.   amoxicillin 500 MG tablet Commonly known as: AMOXIL Take 1 tablet (500 mg total) by mouth 2 (two) times daily for 14 days.   aspirin 81 MG chewable tablet Commonly known as: Aspirin Childrens Chew 1 tablet (81 mg total) by mouth 2 (two) times daily. For 6 weeks for DVT prophylaxis after surgery   atorvastatin 20 MG tablet Commonly known as: LIPITOR Take 20 mg by mouth daily.   buPROPion 150 MG 24 hr tablet Commonly known as: WELLBUTRIN XL Take 450 mg by mouth daily.   DULoxetine 60 MG capsule Commonly known as: CYMBALTA Take 60 mg by mouth daily.   empagliflozin 10 MG Tabs tablet Commonly known as: JARDIANCE Take 10 mg by mouth daily.   FLUoxetine 40 MG  capsule Commonly known as: PROZAC Take 80 mg by mouth daily.   gabapentin 800 MG tablet Commonly known as: NEURONTIN Take 800 mg by mouth 4 (four) times daily.   meloxicam 7.5 MG tablet Commonly known as: MOBIC Take 1 tablet (7.5 mg total) by mouth 2 (two) times daily. What changed:  medication strength how much to take when to take this   methocarbamol 500 MG tablet Commonly known as: Robaxin Take 1 tablet (500 mg total) by mouth 4 (four) times daily.   Mounjaro 5 MG/0.5ML Pen Generic drug: tirzepatide Inject 5 mg into the skin once a week.   omeprazole 20 MG  capsule Commonly known as: PRILOSEC Take 1 capsule (20 mg total) by mouth daily. To gastric protection while taking NSAIDs   oxyCODONE 5 MG immediate release tablet Commonly known as: Oxy IR/ROXICODONE Take 1-2 pills every 6 hrs as needed for severe pain, no more than 6 per day   Potassium 99 MG Tabs Take 99 mg by mouth daily.   promethazine 12.5 MG tablet Commonly known as: PHENERGAN Take 1 tablet (12.5 mg total) by mouth every 6 (six) hours as needed for nausea or vomiting.   topiramate 25 MG tablet Commonly known as: TOPAMAX Take 1 tablet by mouth 2 (two) times daily.   Vitamin D (Ergocalciferol) 1.25 MG (50000 UNIT) Caps capsule Commonly known as: DRISDOL Take 50,000 Units by mouth once a week.           Alfonse Alpers, [place] 05/11/2023

## 2023-05-11 NOTE — Progress Notes (Signed)
   ORTHOPAEDIC PROGRESS NOTE  s/p Procedure(s): TOTAL SHOULDER REVISION  SUBJECTIVE: Reports mild pain about operative site. Nerve block still working. They are frustrated they had to stay the night. Discharge orders were placed yesterday which I showed to her husband. Discussed she had to be admitted for an inpatient only procedure but she was to be discharged after.   No chest pain. No SOB. No nausea/vomiting. No other complaints.  OBJECTIVE: PE: General: sitting up in hospital bed, NAD RUE: Dressing CDI and sling well fitting. Swelling of right hand.  Axillary nerve sensation/motor altered in setting of block and unable to be fully tested.  Distal motor and sensory altered in setting of block.   Vitals:   05/11/23 0620 05/11/23 0952  BP: 127/71 111/60  Pulse: 76 68  Resp: 18 18  Temp: 97.8 F (36.6 C) 98.3 F (36.8 C)  SpO2: 97% 95%     ASSESSMENT: Lorraine Horton is a 63 y.o. female doing well postoperatively. POD#1  PLAN: Weightbearing: NWB RUE Insicional and dressing care: Reinforce dressings as needed Orthopedic device(s):  Sling Showering: post-op day #2 VTE prophylaxis: Aspirin Pain control: PRN pain medications, minimize narcotics as able Follow - up plan: 1 week in office Dispo: plan to discharge home today  Contact information:  Weekdays 8-5 Dr. Ramond Marrow, Alfonse Alpers PA-C, After hours and holidays please check [email or web address] for group call information for [facility]  Alfonse Alpers, PA-C 05/11/2023

## 2023-05-24 LAB — AEROBIC/ANAEROBIC CULTURE W GRAM STAIN (SURGICAL/DEEP WOUND)
Culture: NO GROWTH
Culture: NO GROWTH
Gram Stain: NONE SEEN
Gram Stain: NONE SEEN
